# Patient Record
Sex: Male | Born: 1975
Health system: Southern US, Community
[De-identification: ages and names within clinical notes are randomized; demographics above are authoritative.]

## PROBLEM LIST (undated history)

## (undated) DIAGNOSIS — M1990 Unspecified osteoarthritis, unspecified site: Secondary | ICD-10-CM

## (undated) DIAGNOSIS — F419 Anxiety disorder, unspecified: Secondary | ICD-10-CM

## (undated) HISTORY — PX: TONSILLECTOMY: SUR1361

## (undated) HISTORY — PX: HERNIA REPAIR: SHX51

---

## 2009-09-08 ENCOUNTER — Emergency Department (HOSPITAL_COMMUNITY): Admission: EM | Admit: 2009-09-08 | Discharge: 2009-09-08 | Payer: Self-pay | Admitting: Emergency Medicine

## 2010-03-06 ENCOUNTER — Other Ambulatory Visit: Admission: RE | Admit: 2010-03-06 | Discharge: 2010-03-06 | Payer: Self-pay | Admitting: Urology

## 2013-04-08 ENCOUNTER — Other Ambulatory Visit: Payer: Self-pay | Admitting: Family Medicine

## 2013-04-28 ENCOUNTER — Encounter: Payer: Self-pay | Admitting: Family Medicine

## 2013-04-28 ENCOUNTER — Ambulatory Visit (INDEPENDENT_AMBULATORY_CARE_PROVIDER_SITE_OTHER): Payer: BC Managed Care – PPO | Admitting: Family Medicine

## 2013-04-28 VITALS — BP 128/70 | Ht 71.0 in | Wt 177.0 lb

## 2013-04-28 DIAGNOSIS — K429 Umbilical hernia without obstruction or gangrene: Secondary | ICD-10-CM

## 2013-04-28 NOTE — Progress Notes (Signed)
  Subjective:    Patient ID: Alan Barrera, male    DOB: 1975/09/25, 37 y.o.   MRN: [id number]  HPIUmbilical hernia.  Patient notes. Umbilical pain. Sense of bloating. He umbilicus is more punched out compared to before and now somewhat enlarged. Worse with certain motions. He's been watching this for some time.    Review of Systems No change in bowel habits no vomiting no back pain    Objective:   Physical Exam  Alert no acute distress. Lungs clear. Heart regular rate and rhythm. Abdomen positive periumbilical pain the deep palpation positive palpable and very sensitive hernia in the umbilical region.      Assessment & Plan:  Umbilical hernia discuss needs intervention #2 depression clinically stable maintain same meds. Plan diet exercise discussed.

## 2013-05-09 ENCOUNTER — Encounter (HOSPITAL_COMMUNITY): Payer: Self-pay | Admitting: Pharmacy Technician

## 2013-05-12 ENCOUNTER — Encounter (HOSPITAL_COMMUNITY): Payer: Self-pay

## 2013-05-12 ENCOUNTER — Encounter (HOSPITAL_COMMUNITY)
Admission: RE | Admit: 2013-05-12 | Discharge: 2013-05-12 | Disposition: A | Discharge status: Home / Self Care | Payer: BC Managed Care – PPO | Source: Ambulatory Visit | Attending: General Surgery | Admitting: General Surgery

## 2013-05-12 DIAGNOSIS — Z01812 Encounter for preprocedural laboratory examination: Secondary | ICD-10-CM | POA: Insufficient documentation

## 2013-05-12 HISTORY — DX: Anxiety disorder, unspecified: F41.9

## 2013-05-12 LAB — CBC
HCT: 47.6 % (ref 39.0–52.0)
MCH: 31.8 pg (ref 26.0–34.0)
MCV: 91.7 fL (ref 78.0–100.0)
Platelets: 193 10*3/uL (ref 150–400)
WBC: 4.5 10*3/uL (ref 4.0–10.5)

## 2013-05-12 LAB — BASIC METABOLIC PANEL
BUN: 11 mg/dL (ref 6–23)
Calcium: 9.9 mg/dL (ref 8.4–10.5)
Creatinine, Ser: 0.91 mg/dL (ref 0.50–1.35)
GFR calc Af Amer: >90 mL/min (ref >90)
Sodium: 138 mEq/L (ref 135–145)

## 2013-05-12 NOTE — Patient Instructions (Signed)
Alan Barrera  05/12/2013   Your procedure is scheduled on:   05/19/2013  Report to [facility] at  720  AM.  Call this number if you have problems the morning of surgery: [phone]   Remember:   Do not eat food or drink liquids after midnight.   Take these medicines the morning of surgery with A SIP OF WATER: celexa   Do not wear jewelry, make-up or nail polish.  Do not wear lotions, powders, or perfumes.   Do not shave 48 hours prior to surgery. Men may shave face and neck.  Do not bring valuables to the hospital.  [facility] is not responsible for any belongings or valuables.               Contacts, dentures or bridgework may not be worn into surgery.  Leave suitcase in the car. After surgery it may be brought to your room.  For patients admitted to the hospital, discharge time is determined by your treatment team.               Patients discharged the day of surgery will not be allowed to drive home.  Name and phone number of your driver: family  Special Instructions: Shower using CHG 2 nights before surgery and the night before surgery.  If you shower the day of surgery use CHG.  Use special wash - you have one bottle of CHG for all showers.  You should use approximately 1/3 of the bottle for each shower.   Please read over the following fact sheets that you were given: Pain Booklet, Coughing and Deep Breathing, Surgical Site Infection Prevention, Anesthesia Post-op Instructions and Care and Recovery After Surgery Hernia A hernia happens when an organ inside your body pushes out through a weak spot in your belly (abdominal) wall. Most hernias get worse over time. They can often be pushed back into place (reduced). Surgery may be needed to repair hernias that cannot be pushed into place. HOME CARE  Keep doing normal activities.  Avoid lifting more than 10 pounds (4.5 kilograms).  Cough gently and avoid straining. Over time, these things will:  Increase your  hernia size.  Irritate your hernia.  Break down hernia repairs.  Stop smoking.  Do not wear anything tight over your hernia. Do not keep the hernia in with an outside bandage.  Eat food that is high in fiber (fruit, vegetables, whole grains).  Drink enough fluids to keep your pee (urine) clear or pale yellow.  Take medicines to make your poop soft (stool softeners) if you cannot poop (constipated). GET HELP RIGHT AWAY IF:   You have a fever.  You have belly pain that gets worse.  You feel sick to your stomach (nauseous) and throw up (vomit).  Your skin starts to bulge out.  Your hernia turns a different color, feels hard, or is tender.  You have increased pain or puffiness (swelling) around the hernia.  You poop more or less often.  Your poop does not look the way normally does.  You have watery poop (diarrhea).  You cannot push the hernia back in place by applying gentle pressure while lying down. MAKE SURE YOU:   Understand these instructions.  Will watch your condition.  Will get help right away if you are not doing well or get worse. Document Released: 12/31/2009 Document Revised: 10/05/2011 Document Reviewed: 12/31/2009 [facility] Patient Information 2014 [place], [place]. PATIENT INSTRUCTIONS POST-ANESTHESIA  IMMEDIATELY FOLLOWING SURGERY:  Do not drive or operate machinery for the first twenty four hours after surgery.  Do not make any important decisions for twenty four hours after surgery or while taking narcotic pain medications or sedatives.  If you develop intractable nausea and vomiting or a severe headache please notify your doctor immediately.  FOLLOW-UP:  Please make an appointment with your surgeon as instructed. You do not need to follow up with anesthesia unless specifically instructed to do so.  WOUND CARE INSTRUCTIONS (if applicable):  Keep a dry clean dressing on the anesthesia/puncture wound site if there is drainage.  Once the wound has quit  draining you may leave it open to air.  Generally you should leave the bandage intact for twenty four hours unless there is drainage.  If the epidural site drains for more than 36-48 hours please call the anesthesia department.  QUESTIONS?:  Please feel free to call your physician or the hospital operator if you have any questions, and they will be happy to assist you.

## 2013-05-29 ENCOUNTER — Encounter (HOSPITAL_COMMUNITY)
Admission: RE | Admit: 2013-05-29 | Discharge: 2013-05-29 | Disposition: A | Discharge status: Home / Self Care | Payer: BC Managed Care – PPO | Source: Ambulatory Visit | Attending: Family Medicine | Admitting: Family Medicine

## 2013-06-02 ENCOUNTER — Encounter (HOSPITAL_COMMUNITY): Payer: BC Managed Care – PPO | Admitting: Anesthesiology

## 2013-06-02 ENCOUNTER — Encounter (HOSPITAL_COMMUNITY)
Admission: RE | Disposition: A | Discharge status: Home / Self Care | Payer: Self-pay | Source: Ambulatory Visit | Attending: General Surgery

## 2013-06-02 ENCOUNTER — Ambulatory Visit (HOSPITAL_COMMUNITY): Payer: BC Managed Care – PPO | Admitting: Anesthesiology

## 2013-06-02 ENCOUNTER — Ambulatory Visit (HOSPITAL_COMMUNITY)
Admission: RE | Admit: 2013-06-02 | Discharge: 2013-06-02 | Disposition: A | Discharge status: Home / Self Care | Payer: BC Managed Care – PPO | Source: Ambulatory Visit | Attending: General Surgery | Admitting: General Surgery

## 2013-06-02 ENCOUNTER — Encounter (HOSPITAL_COMMUNITY): Payer: Self-pay | Admitting: *Deleted

## 2013-06-02 DIAGNOSIS — K429 Umbilical hernia without obstruction or gangrene: Secondary | ICD-10-CM | POA: Insufficient documentation

## 2013-06-02 HISTORY — PX: UMBILICAL HERNIA REPAIR: SHX196

## 2013-06-02 HISTORY — PX: INSERTION OF MESH: SHX5868

## 2013-06-02 SURGERY — REPAIR, HERNIA, UMBILICAL, ADULT
Anesthesia: General | Site: Abdomen | Wound class: Clean

## 2013-06-02 MED ORDER — CEFAZOLIN SODIUM-DEXTROSE 2-3 GM-% IV SOLR
INTRAVENOUS | Status: AC
  Filled 2013-06-02: qty 50

## 2013-06-02 MED ORDER — LACTATED RINGERS IV SOLN
INTRAVENOUS | Status: DC | PRN
  Administered 2013-06-02 (×2): via INTRAVENOUS

## 2013-06-02 MED ORDER — ONDANSETRON HCL 4 MG/2ML IJ SOLN
INTRAMUSCULAR | Status: AC
  Filled 2013-06-02: qty 2

## 2013-06-02 MED ORDER — CHLORHEXIDINE GLUCONATE 4 % EX LIQD: 1.0000 "application " | Freq: Once | CUTANEOUS | Status: DC

## 2013-06-02 MED ORDER — FENTANYL CITRATE 0.05 MG/ML IJ SOLN
INTRAMUSCULAR | Status: DC | PRN
  Administered 2013-06-02 (×5): 50 ug via INTRAVENOUS

## 2013-06-02 MED ORDER — FENTANYL CITRATE 0.05 MG/ML IJ SOLN
25.0000 ug | INTRAMUSCULAR | Status: AC
  Administered 2013-06-02 (×2): 25 ug via INTRAVENOUS

## 2013-06-02 MED ORDER — FENTANYL CITRATE 0.05 MG/ML IJ SOLN
INTRAMUSCULAR | Status: AC
  Filled 2013-06-02: qty 5

## 2013-06-02 MED ORDER — MIDAZOLAM HCL 2 MG/2ML IJ SOLN
1.0000 mg | INTRAMUSCULAR | Status: DC | PRN
  Administered 2013-06-02: 2 mg via INTRAVENOUS

## 2013-06-02 MED ORDER — SUCCINYLCHOLINE CHLORIDE 20 MG/ML IJ SOLN
INTRAMUSCULAR | Status: AC
  Filled 2013-06-02: qty 1

## 2013-06-02 MED ORDER — FENTANYL CITRATE 0.05 MG/ML IJ SOLN
INTRAMUSCULAR | Status: AC
  Filled 2013-06-02: qty 2

## 2013-06-02 MED ORDER — 0.9 % SODIUM CHLORIDE (POUR BTL) OPTIME
TOPICAL | Status: DC | PRN
  Administered 2013-06-02: 500 mL

## 2013-06-02 MED ORDER — BUPIVACAINE HCL (PF) 0.5 % IJ SOLN
INTRAMUSCULAR | Status: DC | PRN
  Administered 2013-06-02: 5 mL

## 2013-06-02 MED ORDER — HYDROCODONE-ACETAMINOPHEN 5-325 MG PO TABS: 1.0000 | ORAL_TABLET | ORAL | Status: DC | PRN

## 2013-06-02 MED ORDER — LACTATED RINGERS IV SOLN
INTRAVENOUS | Status: DC
  Administered 2013-06-02: 09:00:00 via INTRAVENOUS

## 2013-06-02 MED ORDER — FENTANYL CITRATE 0.05 MG/ML IJ SOLN
25.0000 ug | INTRAMUSCULAR | Status: DC | PRN
  Administered 2013-06-02 (×3): 50 ug via INTRAVENOUS

## 2013-06-02 MED ORDER — LIDOCAINE HCL (PF) 1 % IJ SOLN
INTRAMUSCULAR | Status: AC
  Filled 2013-06-02: qty 5

## 2013-06-02 MED ORDER — MIDAZOLAM HCL 2 MG/2ML IJ SOLN
INTRAMUSCULAR | Status: AC
  Filled 2013-06-02: qty 2

## 2013-06-02 MED ORDER — PROPOFOL 10 MG/ML IV BOLUS
INTRAVENOUS | Status: DC | PRN
  Administered 2013-06-02: 150 mg via INTRAVENOUS
  Administered 2013-06-02 (×2): 50 mg via INTRAVENOUS

## 2013-06-02 MED ORDER — PROPOFOL 10 MG/ML IV BOLUS
INTRAVENOUS | Status: AC
  Filled 2013-06-02: qty 20

## 2013-06-02 MED ORDER — GLYCOPYRROLATE 0.2 MG/ML IJ SOLN
INTRAMUSCULAR | Status: DC | PRN
  Administered 2013-06-02: 0.2 mg via INTRAVENOUS

## 2013-06-02 MED ORDER — POVIDONE-IODINE 10 % EX OINT
TOPICAL_OINTMENT | CUTANEOUS | Status: AC
  Filled 2013-06-02: qty 1

## 2013-06-02 MED ORDER — LIDOCAINE HCL (CARDIAC) 20 MG/ML IV SOLN
INTRAVENOUS | Status: DC | PRN
  Administered 2013-06-02: 50 mg via INTRAVENOUS

## 2013-06-02 MED ORDER — CEFAZOLIN SODIUM-DEXTROSE 2-3 GM-% IV SOLR
2.0000 g | INTRAVENOUS | Status: AC
  Administered 2013-06-02: 2 g via INTRAVENOUS

## 2013-06-02 MED ORDER — ONDANSETRON HCL 4 MG/2ML IJ SOLN
4.0000 mg | Freq: Once | INTRAMUSCULAR | Status: AC
  Administered 2013-06-02: 4 mg via INTRAVENOUS

## 2013-06-02 MED ORDER — BUPIVACAINE HCL (PF) 0.5 % IJ SOLN
INTRAMUSCULAR | Status: AC
  Filled 2013-06-02: qty 30

## 2013-06-02 MED ORDER — ONDANSETRON HCL 4 MG/2ML IJ SOLN: 4.0000 mg | Freq: Once | INTRAMUSCULAR | Status: DC | PRN

## 2013-06-02 MED ORDER — POVIDONE-IODINE 10 % OINT PACKET
TOPICAL_OINTMENT | CUTANEOUS | Status: DC | PRN
  Administered 2013-06-02: 1 via TOPICAL

## 2013-06-02 MED ORDER — ROCURONIUM BROMIDE 50 MG/5ML IV SOLN
INTRAVENOUS | Status: AC
  Filled 2013-06-02: qty 1

## 2013-06-02 MED ORDER — EPHEDRINE SULFATE 50 MG/ML IJ SOLN
INTRAMUSCULAR | Status: DC | PRN
  Administered 2013-06-02: 10 mg via INTRAVENOUS

## 2013-06-02 MED ORDER — ATROPINE SULFATE 0.4 MG/ML IJ SOLN
INTRAMUSCULAR | Status: DC | PRN
  Administered 2013-06-02 (×2): 0.2 mg via INTRAVENOUS

## 2013-06-02 MED ORDER — KETOROLAC TROMETHAMINE 30 MG/ML IJ SOLN
30.0000 mg | Freq: Once | INTRAMUSCULAR | Status: AC
  Administered 2013-06-02: 30 mg via INTRAVENOUS
  Filled 2013-06-02: qty 1

## 2013-06-02 SURGICAL SUPPLY — 33 items
BAG HAMPER (MISCELLANEOUS) ×2 IMPLANT
BLADE SURG SZ11 CARB STEEL (BLADE) ×2 IMPLANT
CLOTH BEACON ORANGE TIMEOUT ST (SAFETY) ×2 IMPLANT
COVER LIGHT HANDLE STERIS (MISCELLANEOUS) ×4 IMPLANT
DECANTER SPIKE VIAL GLASS SM (MISCELLANEOUS) ×2 IMPLANT
DURAPREP 26ML APPLICATOR (WOUND CARE) ×2 IMPLANT
ELECT REM PT RETURN 9FT ADLT (ELECTROSURGICAL) ×2
ELECTRODE REM PT RTRN 9FT ADLT (ELECTROSURGICAL) ×1 IMPLANT
GLOVE BIOGEL PI IND STRL 8 (GLOVE) ×1 IMPLANT
GLOVE BIOGEL PI INDICATOR 8 (GLOVE) ×1
GLOVE ECLIPSE 7.0 STRL STRAW (GLOVE) ×2 IMPLANT
GLOVE ECLIPSE 7.5 STRL STRAW (GLOVE) ×2 IMPLANT
GLOVE INDICATOR 7.5 STRL GRN (GLOVE) ×2 IMPLANT
GOWN STRL REIN XL XLG (GOWN DISPOSABLE) ×4 IMPLANT
INST SET MINOR GENERAL (KITS) ×2 IMPLANT
KIT ROOM TURNOVER APOR (KITS) ×2 IMPLANT
LIGASURE IMPACT 36 18CM CVD LR (INSTRUMENTS) ×2 IMPLANT
MANIFOLD NEPTUNE II (INSTRUMENTS) ×2 IMPLANT
NEEDLE HYPO 25X1 1.5 SAFETY (NEEDLE) ×2 IMPLANT
NS IRRIG 1000ML POUR BTL (IV SOLUTION) ×2 IMPLANT
PACK MINOR (CUSTOM PROCEDURE TRAY) ×2 IMPLANT
PAD ARMBOARD 7.5X6 YLW CONV (MISCELLANEOUS) ×2 IMPLANT
PATCH VENTRAL SMALL 4.3 (Mesh Specialty) ×2 IMPLANT
SET BASIN LINEN APH (SET/KITS/TRAYS/PACK) ×2 IMPLANT
SPONGE GAUZE 2X2 8PLY STRL LF (GAUZE/BANDAGES/DRESSINGS) ×2 IMPLANT
STAPLER VISISTAT (STAPLE) ×2 IMPLANT
SUT ETHIBOND NAB MO 7 #0 18IN (SUTURE) ×2 IMPLANT
SUT VIC AB 2-0 CT2 27 (SUTURE) ×2 IMPLANT
SUT VIC AB 3-0 SH 27 (SUTURE) ×1
SUT VIC AB 3-0 SH 27X BRD (SUTURE) ×1 IMPLANT
SUT VICRYL AB 3 0 TIES (SUTURE) IMPLANT
SYR CONTROL 10ML LL (SYRINGE) ×2 IMPLANT
TAPE CLOTH SURG 4X10 WHT LF (GAUZE/BANDAGES/DRESSINGS) ×2 IMPLANT

## 2013-06-02 NOTE — Op Note (Signed)
Patient:  Alan Barrera  DOB:  21-Apr-1976  MRN:  [id number]   Preop Diagnosis:  Umbilical hernia  Postop Diagnosis:  Same  Procedure:  Umbilical herniorrhaphy with mesh  Surgeon:  Franky Macho, M.D.  Anes:  General  Indications:  Patient is a 37 year old white male who was lifting a heavy object at work and developed an umbilical hernia. The risks and benefits of the procedure including bleeding, infection, and recurrence of the hernia were fully explained to the patient, who gave informed consent.  Procedure note:  The patient was placed in the supine position. After general anesthesia was administered, the abdomen was prepped and draped using usual sterile technique with DuraPrep. Surgical site confirmation was performed.  An infraumbilical incision was made down to the fascia. The umbilicus was freed away from the underlying hernia defect. The hernia sac was excised and omentum was noted to be achieved to this area. In order to facilitate reduction of the hernia, and a small amount of omentum was excised using the LigaSure. The omentum was then reduced into the abdominal cavity and a 4.2 cm proceed patch was placed in this region and secured to the fascia using 2-0 Ethibond interrupted sutures. The overlying fascia to cover the mesh was closed transversely using 2-0 Ethibond interrupted sutures. The umbilicus was secured back to the fascia using a 2-0 Vicryl interrupted suture. The subcutaneous layer was reapproximated using 3-0 Vicryl interrupted suture. The skin was closed using staples. 0.5% Sensorcaine was instilled the surrounding wound. Betadine ointment and dressed a dressing were applied.  All tape and needle counts were correct at the end of the procedure. Patient was awakened and transferred to PACU in stable condition.  Complications:  None  EBL:  Minimal  Specimen:  None

## 2013-06-02 NOTE — H&P (Signed)
Alan Barrera is an 37 y.o. male.   Chief Complaint: Umbilical hernia HPI: Patient is a 37 year old white male who sustained an umbilical hernia while lifting something heavy at work. He now presents for an umbilical herniorrhaphy.  Past Medical History  Diagnosis Date  . Anxiety     Past Surgical History  Procedure Laterality Date  . Tonsillectomy      History reviewed. No pertinent family history. Social History:  reports that he has never smoked. He does not have any smokeless tobacco history on file. He reports that he does not drink alcohol or use illicit drugs.  Allergies: No Known Allergies  Medications Prior to Admission  Medication Sig Dispense Refill  . citalopram (CELEXA) 40 MG tablet Take 40 mg by mouth daily.        No results found for this or any previous visit (from the past 48 hour(s)). No results found.  Review of Systems  Constitutional: Negative.   HENT: Negative.   Respiratory: Negative.   Cardiovascular: Negative.   Gastrointestinal: Positive for abdominal pain.  Skin: Negative.   All other systems reviewed and are negative.    Blood pressure 133/88, pulse 73, temperature 97.7 F (36.5 C), temperature source Oral, resp. rate 24, SpO2 97.00%. Physical Exam  Constitutional: He is oriented to person, place, and time. He appears well-developed and well-nourished.  HENT:  Head: Normocephalic and atraumatic.  Neck: Normal range of motion. Neck supple.  Cardiovascular: Normal rate, regular rhythm and normal heart sounds.   Respiratory: Effort normal and breath sounds normal.  GI: Soft. Bowel sounds are normal. He exhibits no distension. There is no rebound.  Moderate size umbilical hernia, reducible.  Neurological: He is alert and oriented to person, place, and time.  Skin: Skin is warm and dry.     Assessment/Plan Impression: Umbilical hernia Plan: We'll proceed with an umbilical herniorrhaphy with mesh on 06/02/2013. The risks and benefits  of the procedure including bleeding, infection, and recurrence of the hernia were fully explained to the patient, who gave informed consent.  Alan Barrera A 06/02/2013, 8:11 AM

## 2013-06-02 NOTE — Transfer of Care (Signed)
Immediate Anesthesia Transfer of Care Note  Patient: Alan Barrera  Procedure(s) Performed: Procedure(s): HERNIA REPAIR UMBILICAL ADULT (N/A) INSERTION OF MESH (N/A)  Patient Location: PACU  Anesthesia Type:General  Level of Consciousness: awake, alert , oriented and patient cooperative  Airway & Oxygen Therapy: Patient Spontanous Breathing and Patient connected to face mask oxygen  Post-op Assessment: Report given to PACU RN and Post -op Vital signs reviewed and stable  Post vital signs: Reviewed and stable  Complications: No apparent anesthesia complications

## 2013-06-02 NOTE — Interval H&P Note (Signed)
History and Physical Interval Note:  06/02/2013 8:13 AM  Alan Barrera  has presented today for surgery, with the diagnosis of umbilical hernia  The various methods of treatment have been discussed with the patient and family. After consideration of risks, benefits and other options for treatment, the patient has consented to  Procedure(s): HERNIA REPAIR UMBILICAL ADULT (N/A) as a surgical intervention .  The patient's history has been reviewed, patient examined, no change in status, stable for surgery.  I have reviewed the patient's chart and labs.  Questions were answered to the patient's satisfaction.     Franky Macho A

## 2013-06-02 NOTE — Anesthesia Preprocedure Evaluation (Addendum)
Anesthesia Evaluation  Patient identified by MRN, date of birth, ID band Patient awake    Reviewed: Allergy & Precautions, H&P , NPO status , Patient's Chart, lab work & pertinent test results, reviewed documented beta blocker date and time   Airway Mallampati: I TM Distance: >3 FB     Dental  (+) Teeth Intact   Pulmonary neg pulmonary ROS,  breath sounds clear to auscultation        Cardiovascular negative cardio ROS  Rhythm:Regular Rate:Normal     Neuro/Psych PSYCHIATRIC DISORDERS Anxiety    GI/Hepatic negative GI ROS,   Endo/Other    Renal/GU      Musculoskeletal   Abdominal   Peds  Hematology   Anesthesia Other Findings   Reproductive/Obstetrics                          Anesthesia Physical Anesthesia Plan  ASA: II  Anesthesia Plan: General   Post-op Pain Management:    Induction: Intravenous  Airway Management Planned: LMA  Additional Equipment:   Intra-op Plan:   Post-operative Plan: Extubation in OR  Informed Consent: I have reviewed the patients History and Physical, chart, labs and discussed the procedure including the risks, benefits and alternatives for the proposed anesthesia with the patient or authorized representative who has indicated his/her understanding and acceptance.     Plan Discussed with:   Anesthesia Plan Comments:         Anesthesia Quick Evaluation

## 2013-06-02 NOTE — Anesthesia Postprocedure Evaluation (Signed)
  Anesthesia Post-op Note  Patient: Alan Barrera  Procedure(s) Performed: Procedure(s): HERNIA REPAIR UMBILICAL ADULT (N/A) INSERTION OF MESH (N/A)  Patient Location: PACU  Anesthesia Type:General  Level of Consciousness: awake, alert , oriented and patient cooperative  Airway and Oxygen Therapy: Patient Spontanous Breathing and Patient connected to face mask oxygen  Post-op Pain: mild  Post-op Assessment: Post-op Vital signs reviewed, Patient's Cardiovascular Status Stable, Respiratory Function Stable, Patent Airway, No signs of Nausea or vomiting and Pain level controlled  Post-op Vital Signs: Reviewed and stable  Complications: No apparent anesthesia complications

## 2013-06-02 NOTE — Anesthesia Procedure Notes (Signed)
Procedure Name: LMA Insertion Date/Time: 06/02/2013 8:58 AM Performed by: Pernell Dupre, Tailey Top A Pre-anesthesia Checklist: Patient identified, Timeout performed, Emergency Drugs available, Suction available and Patient being monitored Patient Re-evaluated:Patient Re-evaluated prior to inductionOxygen Delivery Method: Circle system utilized Preoxygenation: Pre-oxygenation with 100% oxygen Intubation Type: IV induction Ventilation: Mask ventilation without difficulty LMA: LMA inserted LMA Size: 4.0 Number of attempts: 1 Placement Confirmation: positive ETCO2 and breath sounds checked- equal and bilateral Tube secured with: Tape Dental Injury: Teeth and Oropharynx as per pre-operative assessment

## 2013-06-02 NOTE — Preoperative (Signed)
Beta Blockers   Reason not to administer Beta Blockers:Not Applicable 

## 2013-06-05 ENCOUNTER — Encounter (HOSPITAL_COMMUNITY): Payer: Self-pay | Admitting: General Surgery

## 2013-08-05 ENCOUNTER — Other Ambulatory Visit: Payer: Self-pay | Admitting: Family Medicine

## 2014-02-03 ENCOUNTER — Other Ambulatory Visit: Payer: Self-pay | Admitting: Family Medicine

## 2014-03-16 ENCOUNTER — Encounter: Payer: Self-pay | Admitting: Nurse Practitioner

## 2014-03-16 ENCOUNTER — Ambulatory Visit (INDEPENDENT_AMBULATORY_CARE_PROVIDER_SITE_OTHER): Payer: BC Managed Care – PPO | Admitting: Nurse Practitioner

## 2014-03-16 VITALS — BP 132/88 | Ht 71.0 in | Wt 172.8 lb

## 2014-03-16 DIAGNOSIS — R6882 Decreased libido: Secondary | ICD-10-CM

## 2014-03-16 DIAGNOSIS — B880 Other acariasis: Secondary | ICD-10-CM

## 2014-03-16 MED ORDER — PREDNISONE 20 MG PO TABS: ORAL_TABLET | ORAL | Status: DC

## 2014-03-16 MED ORDER — CLOBETASOL PROPIONATE 0.05 % EX CREA: 1.0000 "application " | TOPICAL_CREAM | Freq: Two times a day (BID) | CUTANEOUS | Status: DC

## 2014-03-16 NOTE — Patient Instructions (Signed)
Loratadine 10 mg in the morning Benadryl 25 mg at night 

## 2014-03-21 ENCOUNTER — Ambulatory Visit (INDEPENDENT_AMBULATORY_CARE_PROVIDER_SITE_OTHER): Payer: BC Managed Care – PPO | Admitting: Family Medicine

## 2014-03-21 ENCOUNTER — Encounter: Payer: Self-pay | Admitting: Family Medicine

## 2014-03-21 VITALS — BP 124/84 | Ht 71.0 in | Wt 171.0 lb

## 2014-03-21 DIAGNOSIS — R21 Rash and other nonspecific skin eruption: Secondary | ICD-10-CM

## 2014-03-21 MED ORDER — PREDNISONE 20 MG PO TABS: ORAL_TABLET | ORAL | Status: DC

## 2014-03-21 NOTE — Patient Instructions (Signed)
Tick Bite Information Ticks are insects that attach themselves to the skin and draw blood for food. There are various types of ticks. Common types include wood ticks and deer ticks. Most ticks live in shrubs and grassy areas. Ticks can climb onto your body when you make contact with leaves or grass where the tick is waiting. The most common places on the body for ticks to attach themselves are the scalp, neck, armpits, waist, and groin. Most tick bites are harmless, but sometimes ticks carry germs that cause diseases. These germs can be spread to a person during the tick's feeding process. The chance of a disease spreading through a tick bite depends on:   The type of tick.  Time of year.   How long the tick is attached.   Geographic location.  HOW CAN YOU PREVENT TICK BITES? Take these steps to help prevent tick bites when you are outdoors:  Wear protective clothing. Long sleeves and long pants are best.   Wear white clothes so you can see ticks more easily.  Tuck your pant legs into your socks.   If walking on a trail, stay in the middle of the trail to avoid brushing against bushes.  Avoid walking through areas with long grass.  Put insect repellent on all exposed skin and along boot tops, pant legs, and sleeve cuffs.   Check clothing, hair, and skin repeatedly and before going inside.   Brush off any ticks that are not attached.  Take a shower or bath as soon as possible after being outdoors.  WHAT IS THE PROPER WAY TO REMOVE A TICK? Ticks should be removed as soon as possible to help prevent diseases caused by tick bites. 1. If latex gloves are available, put them on before trying to remove a tick.  2. Using fine-point tweezers, grasp the tick as close to the skin as possible. You may also use curved forceps or a tick removal tool. Grasp the tick as close to its head as possible. Avoid grasping the tick on its body. 3. Pull gently with steady upward pressure until  the tick lets go. Do not twist the tick or jerk it suddenly. This may break off the tick's head or mouth parts. 4. Do not squeeze or crush the tick's body. This could force disease-carrying fluids from the tick into your body.  5. After the tick is removed, wash the bite area and your hands with soap and water or other disinfectant such as alcohol. 6. Apply a small amount of antiseptic cream or ointment to the bite site.  7. Wash and disinfect any instruments that were used.  Do not try to remove a tick by applying a hot match, petroleum jelly, or fingernail polish to the tick. These methods do not work and may increase the chances of disease being spread from the tick bite.  WHEN SHOULD YOU SEEK MEDICAL CARE? Contact your health care provider if you are unable to remove a tick from your skin or if a part of the tick breaks off and is stuck in the skin.  After a tick bite, you need to be aware of signs and symptoms that could be related to diseases spread by ticks. Contact your health care provider if you develop any of the following in the days or weeks after the tick bite:  Unexplained fever.  Rash. A circular rash that appears days or weeks after the tick bite may indicate the possibility of Lyme disease. The rash may resemble   a target with a bull's-eye and may occur at a different part of your body than the tick bite.  Redness and swelling in the area of the tick bite.   Tender, swollen lymph glands.   Diarrhea.   Weight loss.   Cough.   Fatigue.   Muscle, joint, or bone pain.   Abdominal pain.   Headache.   Lethargy or a change in your level of consciousness.  Difficulty walking or moving your legs.   Numbness in the legs.   Paralysis.  Shortness of breath.   Confusion.   Repeated vomiting.  Document Released: 07/10/2000 Document Revised: 05/03/2013 Document Reviewed: 12/21/2012 [employer] Patient Information 2015 [employer]. This information is  not intended to replace advice given to you by your health care provider. Make sure you discuss any questions you have with your health care provider.  

## 2014-03-21 NOTE — Progress Notes (Signed)
   Subjective:    Patient ID: Alan Barrera, male    DOB: [birth year], 38 y.o.   MRN: [id number]  HPI Patient is here today for follow up rash. Please see previous note he relates a lot of itching and burning  Review of Systems No fever no vomiting no tick bites    Objective:   Physical Exam He has an extensive amount of rash on lower abdomen some areas excoriated multiple small bumps that's consistent with straw mites. He should gradually get better with treatment       Assessment & Plan:  Rash-I. feel that this is exposure to straw mites. I believe that the patient sustained a fair amount of bites he has been using a cream which helps but he would benefit from prednisone to therefore we prescribed prednisone Benadryl as needed for itching should gradually get better. The prescription was faxed to [facility]

## 2014-03-22 ENCOUNTER — Encounter: Payer: Self-pay | Admitting: Nurse Practitioner

## 2014-03-22 NOTE — Progress Notes (Signed)
Subjective:  Presents complaints of rash that started over the past few days. Began after patient was doing some yard work cutting a tree. Very pruritic. 2 or 3 lesions on the arms. No fever. No known allergens. No cough runny nose wheezing sore throat or ear pain. At end of visit patient requesting a check on his testosterone level, has had some decreased libido and is being treated for erectile dysfunction. We will order level explained to patient that we're very careful about he receives testosterone therapy due to significant adverse effects such as heart attack or stroke in some patients.  Objective:   BP 132/88  Ht  (1.803 m)  Wt 172 lb 12.8 oz (78.382 kg)  BMI 24.11 kg/m2 NAD. Alert, oriented. Lungs clear. Heart regular rate rhythm. Multiple mildly erythematous whelps noted with some excoriation. A few have a tiny clear fluid filled papule in the center. Majority lesions on the lower abdomen and back area, 2 faint lesions on the right arm one on the left, no edema or erythema at the sites.  Assessment: Chigger bites  Decreased libido - Plan: Testosterone  Plan:  Meds ordered this encounter  Medications  . clobetasol cream (TEMOVATE) 0.05 %    Sig: Apply 1 application topically 2 (two) times daily.    Dispense:  30 g    Refill:  0    Order Specific Question:  Supervising Provider    Answer:  Merlyn Albert [[id number]]  . DISCONTD: predniSONE (DELTASONE) 20 MG tablet    Sig: 3 po qd x 3 d then 2 po qd x 3 d then 1 po qd x 3 d    Dispense:  18 tablet    Refill:  0    Order Specific Question:  Supervising Provider    Answer:  Riccardo Dubin   Given prescription for prednisone in case it is needed over the weekend. Antihistamines as directed. Expect gradual improvement, call back if worsens or persists.

## 2015-08-03 ENCOUNTER — Emergency Department (HOSPITAL_COMMUNITY): Payer: Self-pay

## 2015-08-03 ENCOUNTER — Encounter (HOSPITAL_COMMUNITY): Payer: Self-pay

## 2015-08-03 ENCOUNTER — Emergency Department (HOSPITAL_COMMUNITY)
Admission: EM | Admit: 2015-08-03 | Discharge: 2015-08-03 | Disposition: A | Discharge status: Home / Self Care | Payer: Self-pay | Attending: Emergency Medicine | Admitting: Emergency Medicine

## 2015-08-03 DIAGNOSIS — J069 Acute upper respiratory infection, unspecified: Secondary | ICD-10-CM | POA: Insufficient documentation

## 2015-08-03 DIAGNOSIS — Z7952 Long term (current) use of systemic steroids: Secondary | ICD-10-CM | POA: Insufficient documentation

## 2015-08-03 DIAGNOSIS — J40 Bronchitis, not specified as acute or chronic: Secondary | ICD-10-CM

## 2015-08-03 DIAGNOSIS — J209 Acute bronchitis, unspecified: Secondary | ICD-10-CM | POA: Insufficient documentation

## 2015-08-03 DIAGNOSIS — F419 Anxiety disorder, unspecified: Secondary | ICD-10-CM | POA: Insufficient documentation

## 2015-08-03 DIAGNOSIS — Z79899 Other long term (current) drug therapy: Secondary | ICD-10-CM | POA: Insufficient documentation

## 2015-08-03 DIAGNOSIS — Z9889 Other specified postprocedural states: Secondary | ICD-10-CM | POA: Insufficient documentation

## 2015-08-03 MED ORDER — CEPHALEXIN 500 MG PO CAPS
500.0000 mg | ORAL_CAPSULE | Freq: Once | ORAL | Status: AC
  Administered 2015-08-03: 500 mg via ORAL
  Filled 2015-08-03: qty 1

## 2015-08-03 MED ORDER — DEXAMETHASONE 4 MG PO TABS: 4.0000 mg | ORAL_TABLET | Freq: Two times a day (BID) | ORAL | Status: DC

## 2015-08-03 MED ORDER — HYDROCOD POLST-CPM POLST ER 10-8 MG/5ML PO SUER
5.0000 mL | Freq: Once | ORAL | Status: AC
  Administered 2015-08-03: 5 mL via ORAL
  Filled 2015-08-03: qty 5

## 2015-08-03 MED ORDER — CEPHALEXIN 500 MG PO CAPS: 500.0000 mg | ORAL_CAPSULE | Freq: Four times a day (QID) | ORAL | Status: DC

## 2015-08-03 MED ORDER — IBUPROFEN 800 MG PO TABS
800.0000 mg | ORAL_TABLET | Freq: Once | ORAL | Status: AC
  Administered 2015-08-03: 800 mg via ORAL
  Filled 2015-08-03: qty 1

## 2015-08-03 MED ORDER — HYDROCODONE-HOMATROPINE 5-1.5 MG/5ML PO SYRP: 5.0000 mL | ORAL_SOLUTION | Freq: Four times a day (QID) | ORAL | Status: DC | PRN

## 2015-08-03 NOTE — ED Notes (Signed)
Pt c/o cough x 4 days, fever 101.8 last night.  C/O burning in chest with coughing.

## 2015-08-03 NOTE — Discharge Instructions (Signed)
The chest x-ray is negative for acute problem. The oxygen level is 98% on room air. Suspect a bronchitis and upper respiratory infection. Please use Hycodan for cough. This medication may cause drowsiness, please use with caution. Please use Keflex and Decadron daily with food. Use Tylenol every 4 hours, or ibuprofen every 6 hours for fever or aching. Please see Dr. Gerda DissLuking for additional evaluation and follow-up in the office. Cough, Adult Coughing is a reflex that clears your throat and your airways. Coughing helps to heal and protect your lungs. It is normal to cough occasionally, but a cough that happens with other symptoms or lasts a long time may be a sign of a condition that needs treatment. A cough may last only 2-3 weeks (acute), or it may last longer than 8 weeks (chronic). CAUSES Coughing is commonly caused by:  Breathing in substances that irritate your lungs.  A viral or bacterial respiratory infection.  Allergies.  Asthma.  Postnasal drip.  Smoking.  Acid backing up from the stomach into the esophagus (gastroesophageal reflux).  Certain medicines.  Chronic lung problems, including COPD (or rarely, lung cancer).  Other medical conditions such as heart failure. HOME CARE INSTRUCTIONS  Pay attention to any changes in your symptoms. Take these actions to help with your discomfort:  Take medicines only as told by your health care provider.  If you were prescribed an antibiotic medicine, take it as told by your health care provider. Do not stop taking the antibiotic even if you start to feel better.  Talk with your health care provider before you take a cough suppressant medicine.  Drink enough fluid to keep your urine clear or pale yellow.  If the air is dry, use a cold steam vaporizer or humidifier in your bedroom or your home to help loosen secretions.  Avoid anything that causes you to cough at work or at home.  If your cough is worse at night, try sleeping in a  semi-upright position.  Avoid cigarette smoke. If you smoke, quit smoking. If you need help quitting, ask your health care provider.  Avoid caffeine.  Avoid alcohol.  Rest as needed. SEEK MEDICAL CARE IF:   You have new symptoms.  You cough up pus.  Your cough does not get better after 2-3 weeks, or your cough gets worse.  You cannot control your cough with suppressant medicines and you are losing sleep.  You develop pain that is getting worse or pain that is not controlled with pain medicines.  You have a fever.  You have unexplained weight loss.  You have night sweats. SEEK IMMEDIATE MEDICAL CARE IF:  You cough up blood.  You have difficulty breathing.  Your heartbeat is very fast.   This information is not intended to replace advice given to you by your health care provider. Make sure you discuss any questions you have with your health care provider.   Document Released: 01/09/2011 Document Revised: 04/03/2015 Document Reviewed: 09/19/2014 Elsevier Interactive Patient Education [employer]

## 2015-08-03 NOTE — ED Notes (Signed)
PA at bedside.

## 2015-08-03 NOTE — ED Provider Notes (Signed)
CSN: [id number]     Arrival date & time 08/03/15  1151 History   First MD Initiated Contact with Patient 08/03/15 1157     Chief Complaint  Patient presents with  . Cough     (Consider location/radiation/quality/duration/timing/severity/associated sxs/prior Treatment) Patient is a 40 y.o. male presenting with cough. The history is provided by the patient.  Cough Cough characteristics:  Non-productive Severity:  Moderate Onset quality:  Gradual Duration:  4 days Timing:  Intermittent Progression:  Worsening Chronicity:  New Smoker: no   Context: sick contacts and weather changes   Relieved by:  Nothing Worsened by:  Nothing tried Associated symptoms: chills, fever, headaches, myalgias and rhinorrhea   Associated symptoms: no ear pain, no rash, no shortness of breath and no wheezing   Risk factors: no recent travel     Past Medical History  Diagnosis Date  . Anxiety    Past Surgical History  Procedure Laterality Date  . Tonsillectomy    . Umbilical hernia repair N/A 06/02/2013    Procedure: HERNIA REPAIR UMBILICAL ADULT;  Surgeon: Dalia Heading, MD;  Location: [facility];  Service: General;  Laterality: N/A;  . Insertion of mesh N/A 06/02/2013    Procedure: INSERTION OF MESH;  Surgeon: Dalia Heading, MD;  Location: [facility];  Service: General;  Laterality: N/A;   No family history on file. Social History  Substance Use Topics  . Smoking status: Never Smoker   . Smokeless tobacco: None  . Alcohol Use: No    Review of Systems  Constitutional: Positive for fever and chills.  HENT: Positive for congestion and rhinorrhea. Negative for ear pain.   Respiratory: Positive for cough. Negative for shortness of breath and wheezing.   Musculoskeletal: Positive for myalgias.  Skin: Negative for rash.  Neurological: Positive for headaches.  All other systems reviewed and are negative.     Allergies  Review of patient's allergies indicates no known allergies.  Home Medications    Prior to Admission medications   Medication Sig Start Date End Date Taking? Authorizing Provider  cephALEXin (KEFLEX) 500 MG capsule Take 1 capsule (500 mg total) by mouth 4 (four) times daily. 08/03/15   Ivery Quale, PA-C  citalopram (CELEXA) 40 MG tablet TAKE 1 AND 1/2 TABLET BY MOUTH EVERY MORNING    Merlyn Albert, MD  clobetasol cream (TEMOVATE) 0.05 % Apply 1 application topically 2 (two) times daily. 03/16/14   Campbell Riches, NP  dexamethasone (DECADRON) 4 MG tablet Take 1 tablet (4 mg total) by mouth 2 (two) times daily with a meal. 08/03/15   Ivery Quale, PA-C  HYDROcodone-homatropine (HYCODAN) 5-1.5 MG/5ML syrup Take 5 mLs by mouth every 6 (six) hours as needed. 08/03/15   Ivery Quale, PA-C  predniSONE (DELTASONE) 20 MG tablet 3 po qd x 3 d then 2 po qd x 3 d then 1 po qd x 3 d 03/21/14   Babs Sciara, MD   BP 130/89 mmHg  Pulse 88  Temp(Src) 98.5 F (36.9 C) (Oral)  Resp 18  Ht 5\' 10"  (1.778 m)  Wt 79.833 kg  BMI 25.25 kg/m2  SpO2 98% Physical Exam  Constitutional: He is oriented to person, place, and time. He appears well-developed and well-nourished.  Non-toxic appearance.  HENT:  Head: Normocephalic.  Right Ear: Tympanic membrane and external ear normal.  Left Ear: Tympanic membrane and external ear normal.  Nasal congestion present. Mild increase redness of the posterior pharynx. Airway patent. Uvula mildly swollen, but midline.  Eyes: EOM and lids are normal. Pupils are equal, round, and reactive to light.  Neck: Normal range of motion. Neck supple. Carotid bruit is not present.  Cardiovascular: Normal rate, regular rhythm, normal heart sounds, intact distal pulses and normal pulses.   Pulmonary/Chest: Breath sounds normal. No respiratory distress.  Pt speaks in complete sentences.  Abdominal: Soft. Bowel sounds are normal. There is no tenderness. There is no guarding.  Musculoskeletal: Normal range of motion.  Lymphadenopathy:       Head (right side): No  submandibular adenopathy present.       Head (left side): No submandibular adenopathy present.    He has no cervical adenopathy.  Neurological: He is alert and oriented to person, place, and time. He has normal strength. No cranial nerve deficit or sensory deficit.  Skin: Skin is warm and dry. No rash noted.  Psychiatric: He has a normal mood and affect. His speech is normal.  Nursing note and vitals reviewed.   ED Course  Procedures (including critical care time) Labs Review Labs Reviewed - No data to display  Imaging Review Dg Chest 2 View  08/03/2015  CLINICAL DATA:  Cough, and fever, nonproductive cough for 4 days EXAM: CHEST  2 VIEW COMPARISON:  None. FINDINGS: Normal heart size. Clear lungs. No pneumothorax. No pleural effusion. IMPRESSION: No active cardiopulmonary disease. Electronically Signed   By: Jolaine ClickArthur  Hoss M.D.   On: 08/03/2015 12:39   I have personally reviewed and evaluated these images and lab results as part of my medical decision-making.   EKG Interpretation None      MDM  Vital signs stable. Pulse ox 98% on room air. Pt speaks in complete sentences. Exam favors bronchitis and upper respiratory infection. The patient will be treated with Keflex, Decadron, and Hycodan syrup. Patient is encouraged to increase fluids. To use his mask until symptoms have resolved. To see his primary physician Dr. Gerda DissLuking, in the office for additional evaluation and management if not improving. Patient acknowledges understanding of these structures.    Final diagnoses:  Bronchitis  URI (upper respiratory infection)    **I have reviewed nursing notes, vital signs, and all appropriate lab and imaging results for this patient.    Ivery QualeHobson Yael Angerer, PA-C 08/03/15 1331  Geoffery Lyonsouglas Delo, MD 08/04/15 [phone]

## 2016-07-17 ENCOUNTER — Encounter: Payer: Self-pay | Admitting: Family Medicine

## 2016-07-17 ENCOUNTER — Ambulatory Visit (INDEPENDENT_AMBULATORY_CARE_PROVIDER_SITE_OTHER): Payer: BLUE CROSS/BLUE SHIELD | Admitting: Family Medicine

## 2016-07-17 VITALS — BP 124/86 | Temp 98.5°F | Ht 69.0 in | Wt 181.0 lb

## 2016-07-17 DIAGNOSIS — G8929 Other chronic pain: Secondary | ICD-10-CM

## 2016-07-17 DIAGNOSIS — J310 Chronic rhinitis: Secondary | ICD-10-CM

## 2016-07-17 DIAGNOSIS — M5440 Lumbago with sciatica, unspecified side: Secondary | ICD-10-CM | POA: Diagnosis not present

## 2016-07-17 DIAGNOSIS — J329 Chronic sinusitis, unspecified: Secondary | ICD-10-CM

## 2016-07-17 MED ORDER — TRAMADOL HCL 50 MG PO TABS: 50.0000 mg | ORAL_TABLET | Freq: Four times a day (QID) | ORAL | 0 refills | Status: DC | PRN

## 2016-07-17 MED ORDER — PREDNISONE 20 MG PO TABS: ORAL_TABLET | ORAL | 0 refills | Status: DC

## 2016-07-17 MED ORDER — HYDROCODONE-ACETAMINOPHEN 5-325 MG PO TABS: 1.0000 | ORAL_TABLET | Freq: Every evening | ORAL | 0 refills | Status: DC | PRN

## 2016-07-17 MED ORDER — ETODOLAC 400 MG PO TABS
400.0000 mg | ORAL_TABLET | Freq: Two times a day (BID) | ORAL | 1 refills | Status: DC
Start: 2016-07-17 — End: 2017-03-30

## 2016-07-17 MED ORDER — AMOXICILLIN 500 MG PO CAPS: 500.0000 mg | ORAL_CAPSULE | Freq: Three times a day (TID) | ORAL | 0 refills | Status: DC

## 2016-07-17 NOTE — Progress Notes (Signed)
   Subjective:    Patient ID: Alan Barrera, male    DOB: 1975/09/21, 40 y.o.   MRN: [id number] Patient presents with 2 types of back pain Back Pain  This is a new problem. Episode onset: 3 weeks  The pain is present in the gluteal. Radiates to: left leg. Treatments tried: ibuprofen.  several weeks ago had a bad sneezing spell  Developed low back pain after bad sneezing  Now radiates to left hip, and pain radiates all the way down into left calf. Deep ache. Sometimes tooth achy in nature.  Patient gives also history of chronic low back pain. Comes and goes. Generally diffuse over the lumbar region. Without substantial radiation.  Taking ibu prof 800 mg every twice  Heavy equip and lifiting   Pt has had issues where the back would act up    Chest congestion, pain  In lungs. Started 3 days ago. Cough is productive. Positive phlegm intermittently. No fever no chills started with nasal congestion   Review of Systems  Musculoskeletal: Positive for back pain.       Objective:   Physical Exam Alert, mild malaise. Hydration good Vitals stable. frontal/ maxillary tenderness evident positive nasal congestion. pharynx normal neck supple  lungs clear/no crackles or wheezes. heart regular in rhythm Low back diffuse tenderness left side greater than right and apparently lumbar distribution. Plus minus straight leg raise on left. Some sciatic notch tenderness. Distal strength sensation       Assessment & Plan:  Impression 1 rhinosinusitis/bronchitis discussed #2 chronic low back pain. Intermittent comes and goes generally lumbar strain in nature and description. #3 sciatica left-sided. Has faded somewhat over the past week plan prednisone taper. Anti-inflammatory medication. Low back exercise discussed recommended. Antibiotics prescribed symptom care discussed. 25 minutes spent most in discussion regarding long-term stretching and strengthening this patient's best opportunity to avoid  recurrent back. Rationale for not doing MRI at this time discussed. 25 minutes spent most in discussion

## 2016-11-11 ENCOUNTER — Telehealth: Payer: Self-pay | Admitting: Family Medicine

## 2016-11-11 MED ORDER — CITALOPRAM HYDROBROMIDE 40 MG PO TABS: ORAL_TABLET | ORAL | 0 refills | Status: DC

## 2016-11-11 NOTE — Telephone Encounter (Signed)
Ok ththirty d wor

## 2016-11-11 NOTE — Telephone Encounter (Signed)
Refilled/send medication Rx Celexa to [facility], [place], [place]

## 2016-11-11 NOTE — Telephone Encounter (Signed)
Patient said he is out of town for work and left his Rx for Celexa at home.  He wants to know if we can send an Rx to [facility] in [place], [place].  [address] [employer]

## 2016-12-17 IMAGING — DX DG CHEST 2V
2 series · 2 of 2 positions shown · non-contrast
Comparison: None.

CLINICAL DATA: Cough, and fever, nonproductive cough for 4 days

EXAM:
CHEST  2 VIEW

[chest pa]
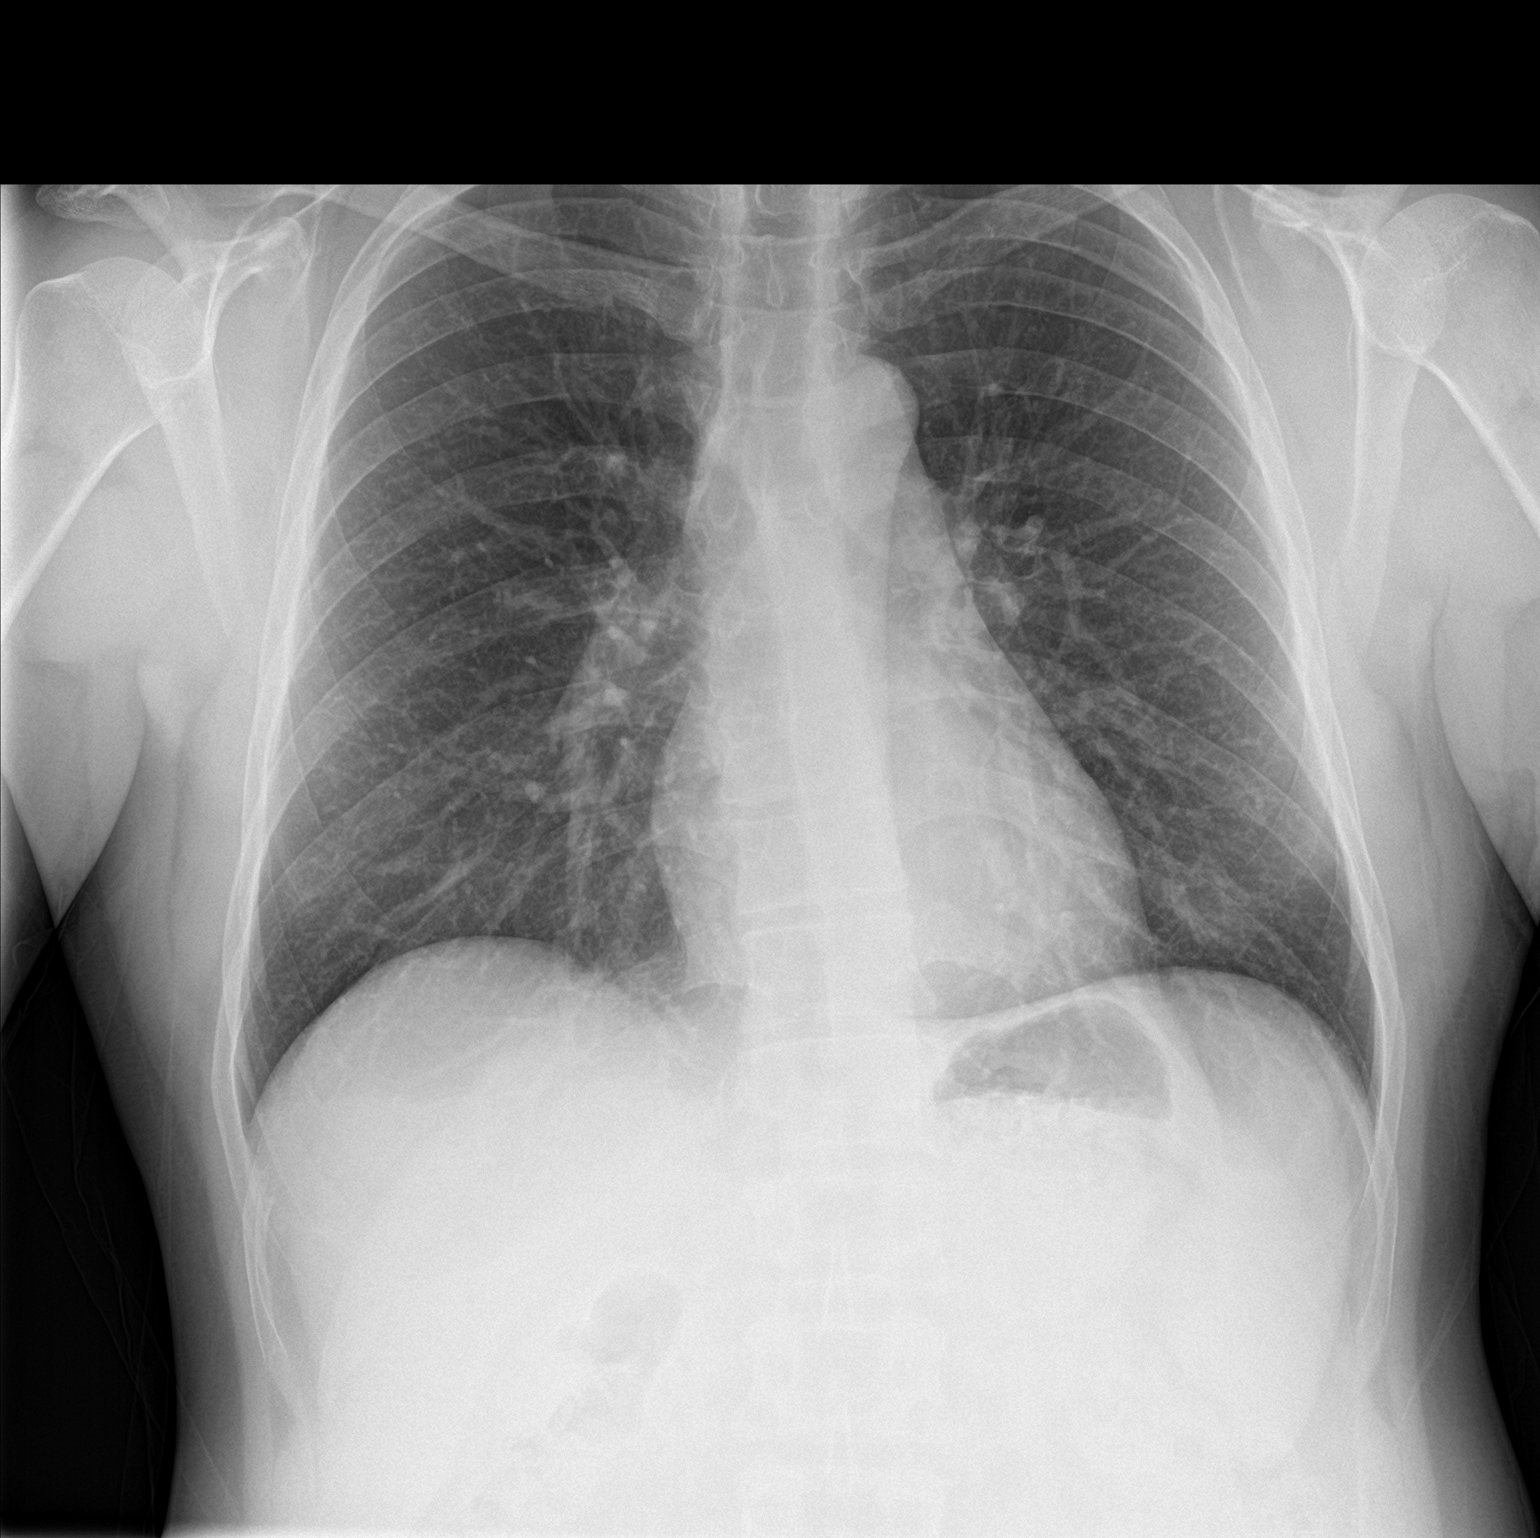

[chest lat]
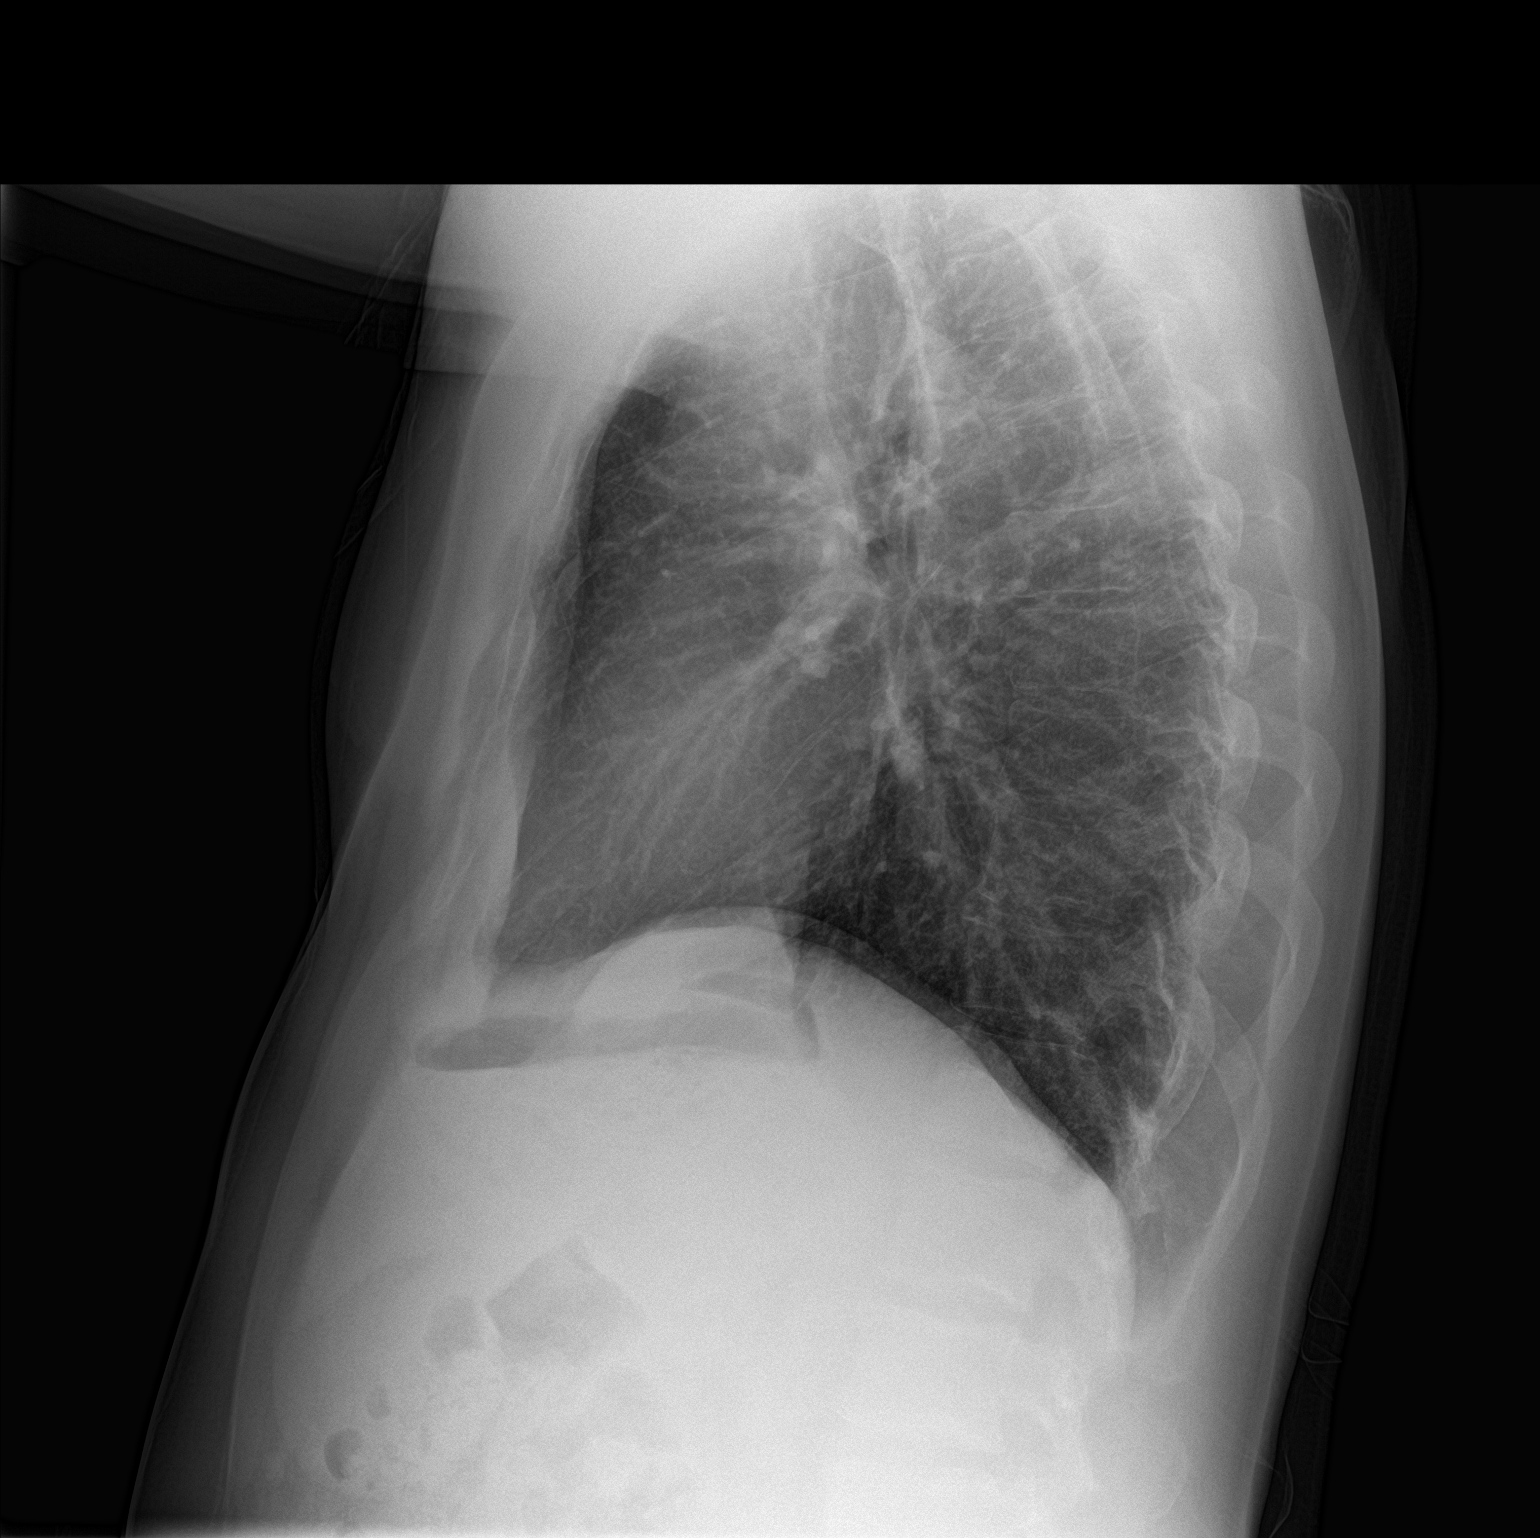

[2 of 2 positions shown; findings below may reference images not displayed]

FINDINGS: Normal heart size. Clear lungs. No pneumothorax. No pleural
effusion.
IMPRESSION: No active cardiopulmonary disease.

## 2017-02-11 ENCOUNTER — Encounter: Payer: BLUE CROSS/BLUE SHIELD | Admitting: Family Medicine

## 2017-02-19 ENCOUNTER — Encounter: Payer: Self-pay | Admitting: Family Medicine

## 2017-03-30 ENCOUNTER — Encounter: Payer: Self-pay | Admitting: Family Medicine

## 2017-03-30 ENCOUNTER — Ambulatory Visit (INDEPENDENT_AMBULATORY_CARE_PROVIDER_SITE_OTHER): Payer: BLUE CROSS/BLUE SHIELD | Admitting: Family Medicine

## 2017-03-30 VITALS — BP 128/86 | Ht 69.0 in | Wt 174.0 lb

## 2017-03-30 DIAGNOSIS — Z79899 Other long term (current) drug therapy: Secondary | ICD-10-CM | POA: Diagnosis not present

## 2017-03-30 DIAGNOSIS — Z13220 Encounter for screening for lipoid disorders: Secondary | ICD-10-CM

## 2017-03-30 DIAGNOSIS — F411 Generalized anxiety disorder: Secondary | ICD-10-CM | POA: Diagnosis not present

## 2017-03-30 MED ORDER — ESCITALOPRAM OXALATE 20 MG PO TABS: 20.0000 mg | ORAL_TABLET | Freq: Every day | ORAL | 5 refills | Status: DC

## 2017-03-30 NOTE — Progress Notes (Signed)
   Subjective:    Patient ID: Alan Barrera, male    DOB: Jul 31, 1975, 41 y.o.   MRN: [id number]  HPIFollow up on anxiety/Depression. Takes celexa 40mg  one and a half tablets daily. Pt states he feels med is not working like it use to.   Initially strted for itrritability and a little   Mo and fa are both on meds for m h issues, bro also   Has noted past three mo things not going as well for irritability and anxiety, not working well for him  Works for [employer]   wide open  irritabile at work too and that is causing some issues  exercises well, usually staying active.        Review of Systems No headache, no major weight loss or weight gain, no chest pain no back pain abdominal pain no change in bowel habits complete ROS otherwise negative     Objective:   Physical Exam Alert vitals stable, NAD. Blood pressure good on repeat. HEENT normal. Lungs clear. Heart regular rate and rhythm.        Assessment & Plan:  Impression generalized anxiety with irritability. PH 9 reveals no evidence of substantial depression. Celexa no longer working. Will switch to Lexapro 20

## 2017-05-05 ENCOUNTER — Encounter: Payer: BLUE CROSS/BLUE SHIELD | Admitting: Family Medicine

## 2017-05-05 DIAGNOSIS — Z029 Encounter for administrative examinations, unspecified: Secondary | ICD-10-CM

## 2017-05-06 ENCOUNTER — Encounter: Payer: Self-pay | Admitting: Family Medicine

## 2017-07-29 ENCOUNTER — Encounter: Payer: Self-pay | Admitting: Family Medicine

## 2018-08-08 ENCOUNTER — Encounter: Payer: Self-pay | Admitting: Family Medicine

## 2018-08-08 ENCOUNTER — Ambulatory Visit (INDEPENDENT_AMBULATORY_CARE_PROVIDER_SITE_OTHER): Payer: 59 | Admitting: Family Medicine

## 2018-08-08 VITALS — BP 124/90 | Ht 69.0 in | Wt 180.0 lb

## 2018-08-08 DIAGNOSIS — Z13220 Encounter for screening for lipoid disorders: Secondary | ICD-10-CM | POA: Diagnosis not present

## 2018-08-08 DIAGNOSIS — N539 Unspecified male sexual dysfunction: Secondary | ICD-10-CM | POA: Diagnosis not present

## 2018-08-08 DIAGNOSIS — F411 Generalized anxiety disorder: Secondary | ICD-10-CM | POA: Diagnosis not present

## 2018-08-08 DIAGNOSIS — Z79899 Other long term (current) drug therapy: Secondary | ICD-10-CM | POA: Diagnosis not present

## 2018-08-08 MED ORDER — CITALOPRAM HYDROBROMIDE 40 MG PO TABS: ORAL_TABLET | ORAL | 5 refills | Status: DC

## 2018-08-08 NOTE — Progress Notes (Signed)
   Subjective:    Patient ID: Alan Barrera, male    DOB: [birth year], 43 y.o.   MRN: [id number]  HPI  Patient is here today to follow up on his chronic health issues.   He has a history of anxiety and is currently on Lexapro 20 mg, in which he does not feel is of any help.  Sex drive he states "is not right".  Review of Systems     Objective:   Physical Exam        Assessment & Plan:

## 2018-08-08 NOTE — Progress Notes (Signed)
Subjective:     Patient ID: Alan Barrera, male   DOB: 21-Jul-1976, 43 y.o.   MRN: [id number]   HPI  Patient is here today to follow up on his chronic health issues.   He has a history of anxiety and is currently on Lexapro 20 mg, in which he does not feel is of any help.  Sex drive he states "is not right". Feels like he has been having problems for the last 10 years, not just while on medication. Reports decreased sex drive and problems with obtaining an erection. This is very concerning to the patient and he feels like there is something wrong.  Has been off of lexapro for about 1 week. Felt like celexa that he was on previously was more effective, reports was taking celexa 40 mg daily. Anxiety was not well controlled on lexapro. Feels like he gets more aggravated easily.  Has never done counseling, not interested at this time. Pt wants to start back on celexa. Denies SI/HI.       Review of Systems  Constitutional: Negative for unexpected weight change.  Respiratory: Negative for shortness of breath.   Cardiovascular: Negative for chest pain.  Gastrointestinal: Negative for abdominal pain.  Psychiatric/Behavioral: Positive for agitation. Negative for dysphoric mood, sleep disturbance and suicidal ideas. The patient is nervous/anxious.         Objective:   Physical Exam Physical Exam Vitals signs and nursing note reviewed.  Constitutional:      General: He is not in acute distress.    Appearance: Normal appearance. He is well-developed. He is not toxic-appearing.  HENT:     Head: Normocephalic and atraumatic.  Neck:     Musculoskeletal: Neck supple.  Cardiovascular:     Rate and Rhythm: Normal rate and regular rhythm.     Heart sounds: Normal heart sounds. No murmur.  Pulmonary:     Effort: Pulmonary effort is normal. No respiratory distress.     Breath sounds: Normal breath sounds.  Skin:    General: Skin is warm and dry.  Neurological:     Mental  Status: He is alert and oriented to person, place, and time.  Psychiatric:        Mood and Affect: Mood normal.        Behavior: Behavior normal.        Thought Content: Thought content normal.    Depression screen [facility] 2/9 08/08/2018 03/30/2017  Decreased Interest 2 0  Down, Depressed, Hopeless 1 1  PHQ - 2 Score 3 1  Altered sleeping 0 0  Tired, decreased energy 1 1  Change in appetite 0 0  Feeling bad or failure about yourself  0 0  Trouble concentrating 0 1  Moving slowly or fidgety/restless 0 0  Suicidal thoughts 0 0  PHQ-9 Score 4 3  Difficult doing work/chores Somewhat difficult -   GAD 7 : Generalized Anxiety Score 08/08/2018 03/30/2017  Nervous, Anxious, on Edge 2 1  Control/stop worrying 1 0  Worry too much - different things 1 1  Trouble relaxing 2 1  Restless 0 1  Easily annoyed or irritable 1 2  Afraid - awful might happen 1 0  Total GAD 7 Score 8 6  Anxiety Difficulty Not difficult at all -        Assessment:     Generalized anxiety disorder  Unspecified male sexual dysfunction - Plan: Testosterone  Screening, lipid - Plan: Lipid panel  High risk medication use - Plan: Hepatic function  panel, Basic metabolic panel      Plan:     1. GAD: Reviewed phq 9 and gad 7 with patient. Will restart celexa per request, start at 20 mg daily x 2 weeks then increase to 40 mg daily. Will stop medication and notify us if he has any problems. F/u in 1 month.  2. Sexual dysfunction: will obtain some basic screening lab work and recommend f/u in 4 weeks with Dr. Brett CanalesSteve to further discuss this.   Dr. Lubertha SouthSteve Luking was consulted on this case and is in agreement with the above treatment plan.

## 2018-08-09 LAB — BASIC METABOLIC PANEL
BUN/Creatinine Ratio: 10 (ref 9–20)
BUN: 11 mg/dL (ref 6–24)
CHLORIDE: 102 mmol/L (ref 96–106)
CO2: 23 mmol/L (ref 20–29)
Calcium: 9.4 mg/dL (ref 8.7–10.2)
Creatinine, Ser: 1.06 mg/dL (ref 0.76–1.27)
GFR calc Af Amer: 98 mL/min/{1.73_m2} (ref >59)
GFR calc non Af Amer: 85 mL/min/{1.73_m2} (ref >59)
Glucose: 129 mg/dL — ABNORMAL HIGH (ref 65–99)
Potassium: 4.2 mmol/L (ref 3.5–5.2)
Sodium: 140 mmol/L (ref 134–144)

## 2018-08-09 LAB — HEPATIC FUNCTION PANEL
ALT: 28 IU/L (ref 0–44)
AST: 17 IU/L (ref 0–40)
Albumin: 4.5 g/dL (ref 3.5–5.5)
Alkaline Phosphatase: 72 IU/L (ref 39–117)
BILIRUBIN TOTAL: 0.6 mg/dL (ref 0.0–1.2)
Bilirubin, Direct: 0.14 mg/dL (ref 0.00–0.40)
Total Protein: 6.7 g/dL (ref 6.0–8.5)

## 2018-08-09 LAB — LIPID PANEL
Chol/HDL Ratio: 4.1 ratio (ref 0.0–5.0)
Cholesterol, Total: 196 mg/dL (ref 100–199)
HDL: 48 mg/dL (ref >39)
LDL Calculated: 111 mg/dL — ABNORMAL HIGH (ref 0–99)
TRIGLYCERIDES: 187 mg/dL — AB (ref 0–149)
VLDL Cholesterol Cal: 37 mg/dL (ref 5–40)

## 2018-08-09 LAB — TESTOSTERONE: Testosterone: 528 ng/dL (ref 264–916)

## 2018-09-05 ENCOUNTER — Encounter: Payer: Self-pay | Admitting: Family Medicine

## 2018-09-05 ENCOUNTER — Ambulatory Visit (INDEPENDENT_AMBULATORY_CARE_PROVIDER_SITE_OTHER): Payer: 59 | Admitting: Family Medicine

## 2018-09-05 VITALS — BP 120/90 | Ht 69.0 in | Wt 181.0 lb

## 2018-09-05 DIAGNOSIS — N5201 Erectile dysfunction due to arterial insufficiency: Secondary | ICD-10-CM

## 2018-09-05 DIAGNOSIS — E119 Type 2 diabetes mellitus without complications: Secondary | ICD-10-CM

## 2018-09-05 DIAGNOSIS — N529 Male erectile dysfunction, unspecified: Secondary | ICD-10-CM | POA: Insufficient documentation

## 2018-09-05 LAB — POCT GLYCOSYLATED HEMOGLOBIN (HGB A1C): Hemoglobin A1C: 4.4 % (ref 4.0–5.6)

## 2018-09-05 MED ORDER — SILDENAFIL CITRATE 20 MG PO TABS: ORAL_TABLET | ORAL | 5 refills | Status: DC

## 2018-09-05 MED ORDER — CITALOPRAM HYDROBROMIDE 40 MG PO TABS: ORAL_TABLET | ORAL | 5 refills | Status: DC

## 2018-09-05 NOTE — Progress Notes (Signed)
Subjective:    Patient ID: Alan LericheRandall W Guallpa, male    DOB: 07-Jan-1976, 43 y.o.   MRN: [id number]  HPI Patient is here today to discuss his chronic health issues.  He has a history of General Anxiety disorder and is taking Celexa 40 mg once per day.  He says he just went up on the medication on Saturday.( he only five days left and would prefer to have # 90 day supply sent in).  He also had some blood work drawn 08/08/2017 that he wants to go over.  Results for orders placed or performed in visit on 08/08/18  Lipid panel  Result Value Ref Range   Cholesterol, Total 196 100 - 199 mg/dL   Triglycerides 563187 (H) 0 - 149 mg/dL   HDL 48 >87>39 mg/dL   VLDL Cholesterol Cal 37 5 - 40 mg/dL   LDL Calculated 564111 (H) 0 - 99 mg/dL   Chol/HDL Ratio 4.1 0.0 - 5.0 ratio  Hepatic function panel  Result Value Ref Range   Total Protein 6.7 6.0 - 8.5 g/dL   Albumin 4.5 3.5 - 5.5 g/dL   Bilirubin Total 0.6 0.0 - 1.2 mg/dL   Bilirubin, Direct 3.320.14 0.00 - 0.40 mg/dL   Alkaline Phosphatase 72 39 - 117 IU/L   AST 17 0 - 40 IU/L   ALT 28 0 - 44 IU/L  Basic metabolic panel  Result Value Ref Range   Glucose 129 (H) 65 - 99 mg/dL   BUN 11 6 - 24 mg/dL   Creatinine, Ser 9.511.06 0.76 - 1.27 mg/dL   GFR calc non Af Amer 85 >59 mL/min/1.73   GFR calc Af Amer 98 >59 mL/min/1.73   BUN/Creatinine Ratio 10 9 - 20   Sodium 140 134 - 144 mmol/L   Potassium 4.2 3.5 - 5.2 mmol/L   Chloride 102 96 - 106 mmol/L   CO2 23 20 - 29 mmol/L   Calcium 9.4 8.7 - 10.2 mg/dL  Testosterone  Result Value Ref Range   Testosterone 528 264 - 916 ng/dL   Anxiety overall doing quit a bit better'   Stays active doing reg activity   Libido not good for ten yrs   Also having difficulty with erections  Used otc upplement, took over the counter, helped some but a d headaches  No alcohol excess, rare one beer evey three months   Prediabetes? No fam hx of diab. Glu was up on b w but non fasing   No other  concerns.   Review of Systems No headache, no major weight loss or weight gain, no chest pain no back pain abdominal pain no change in bowel habits complete ROS otherwise negative     Objective:   Physical Exam   Alert and oriented, vitals reviewed and stable, NAD ENT-TM's and ext canals WNL bilat via otoscopic exam Soft palate, tonsils and post pharynx WNL via oropharyngeal exam Neck-symmetric, no masses; thyroid nonpalpable and nontender Pulmonary-no tachypnea or accessory muscle use; Clear without wheezes via auscultation Card--no abnrml murmurs, rhythm reg and rate WNL Carotid pulses symmetric, without bruits      Assessment & Plan:  Impression 1 chronic anxiety.  Generalized anxiety disorder.  Much improved on medicine Ambien as well discussed  2.  Hyperlipidemia.  Mild elevation of bad cholesterol.  Discussed.  Diet discussed  3.  Erectile dysfunction.  Long discussion held.  This leads to substantial complications with relationships understandably.  Had use of brothers alive and well.  Side  effects benefits discussed medication initiated  Follow-up in 6 months.  Exercise encouraged.

## 2019-02-23 ENCOUNTER — Telehealth: Payer: Self-pay | Admitting: Family Medicine

## 2019-02-23 NOTE — Telephone Encounter (Signed)
Patient is requesting a referral to specialist for hernia he thinks he has again.He had hernia repair back in 2014 and states he has same symptoms as before. He wants to know if he needs to be seen here first or can we refer him out to Dr. Arnoldo Morale again. Please advise

## 2019-02-23 NOTE — Telephone Encounter (Signed)
Pt states he is having some discomfort around his umbilical area. Feels it more after eating or drinking. Started one week ago. No fever.  Same pain as before the surgery. Lost his father 2 and a half months ago to abdominal aneurysm so he would like to get checked as soon as possible. He works out of town mon - [employer] in [place] and gets back home on fridays around 3:30 but states he will get off work to get checked.

## 2019-02-24 NOTE — Telephone Encounter (Signed)
Appointment was taken by the time I got to message. Called pt. He is working late [employer] he said. I gave him number to  urgent care which is open on Saturday for him to call and advised him if worse go to ED. Pt verbalized understanding.

## 2019-02-24 NOTE — Telephone Encounter (Signed)
Currently there is a 350 appointment-when this message is handled if I am totally booked for the day this patient may have to consider going to urgent care with a follow-up visit please read below   please let the patient know that Dr. Richardson Landry is out this week  I could see him later today if he desires  so we could see the patient later today at 350-I recommend reserving that slot until you talk with the patient and find out if he can be there If he cannot be there then I recommend scheduling with Dr. Richardson Landry As for testing it really depends on what we find at the examination

## 2019-02-25 ENCOUNTER — Other Ambulatory Visit: Payer: Self-pay

## 2019-02-25 ENCOUNTER — Ambulatory Visit (HOSPITAL_COMMUNITY)
Admission: EM | Admit: 2019-02-25 | Discharge: 2019-02-25 | Disposition: A | Discharge status: Home / Self Care | Payer: 59 | Attending: Family Medicine | Admitting: Family Medicine

## 2019-02-25 ENCOUNTER — Encounter (HOSPITAL_COMMUNITY): Payer: Self-pay | Admitting: *Deleted

## 2019-02-25 DIAGNOSIS — R1033 Periumbilical pain: Secondary | ICD-10-CM | POA: Diagnosis not present

## 2019-02-25 NOTE — ED Triage Notes (Signed)
Reports umbilical hernia repair 5 yrs ago; over past week periumbilical area "doesn't feel right", especially after eating.  Denies any abd bulging.  Denies fever, n/v/d.

## 2019-02-25 NOTE — ED Provider Notes (Signed)
[facility]    CSN: [id number] Arrival date & time: 02/25/19  1559     History   Chief Complaint Chief Complaint  Patient presents with   Abdominal Pain    HPI Alan Barrera is a 43 y.o. male.   Patient presents with periumbilical pain which he describes as constant, dull pain with intermittent "pinpoint" sharp pains.  He states this is occurring over the area where he previously had umbilical hernia repair done 5 years ago.  He denies fever, chills, change in appetite, vomiting, diarrhea, dysuria, back pain, penile discharge, or testicular pain.  The history is provided by the patient.    Past Medical History:  Diagnosis Date   Anxiety     Patient Active Problem List   Diagnosis Date Noted   Erectile dysfunction 09/05/2018   Generalized anxiety disorder 03/30/2017    Past Surgical History:  Procedure Laterality Date   HERNIA REPAIR     INSERTION OF MESH N/A 06/02/2013   Procedure: INSERTION OF MESH;  Surgeon: Dalia HeadingMark A Jenkins, MD;  Location: [facility];  Service: General;  Laterality: N/A;   TONSILLECTOMY     UMBILICAL HERNIA REPAIR N/A 06/02/2013   Procedure: HERNIA REPAIR UMBILICAL ADULT;  Surgeon: Dalia HeadingMark A Jenkins, MD;  Location: [facility];  Service: General;  Laterality: N/A;       Home Medications    Prior to Admission medications   Medication Sig Start Date End Date Taking? Authorizing Provider  citalopram (CELEXA) 40 MG tablet Take 1 tablet daily. 09/05/18   Merlyn AlbertLuking, William S, MD  sildenafil (REVATIO) 20 MG tablet Take 3 tablets po 1 -2 hours prior to sex 09/05/18   Merlyn AlbertLuking, William S, MD    Family History Family History  Problem Relation Age of Onset   Healthy Mother    AAA (abdominal aortic aneurysm) Father    Hernia Brother     Social History Social History   Tobacco Use   Smoking status: Never Smoker   Smokeless tobacco: Current User    Types: Chew  Substance Use Topics   Alcohol use: No   Drug use: Never      Allergies   Hydrocodone   Review of Systems Review of Systems  Constitutional: Negative for chills and fever.  HENT: Negative for ear pain and sore throat.   Eyes: Negative for pain and visual disturbance.  Respiratory: Negative for cough and shortness of breath.   Cardiovascular: Negative for chest pain and palpitations.  Gastrointestinal: Positive for abdominal pain. Negative for diarrhea, nausea and vomiting.  Genitourinary: Negative for discharge, dysuria, flank pain, hematuria and testicular pain.  Musculoskeletal: Negative for arthralgias and back pain.  Skin: Negative for color change and rash.  Neurological: Negative for seizures and syncope.  All other systems reviewed and are negative.    Physical Exam Triage Vital Signs ED Triage Vitals [02/25/19 1619]  Enc Vitals Group     BP 135/85     Pulse Rate 68     Resp 16     Temp 98.1 F (36.7 C)     Temp Source Oral     SpO2 99 %     Weight      Height      Head Circumference      Peak Flow      Pain Score 2     Pain Loc      Pain Edu?      Excl. in GC?    No data found.  Updated Vital Signs BP 135/85    Pulse 68    Temp 98.1 F (36.7 C) (Oral)    Resp 16    SpO2 99%   Visual Acuity Right Eye Distance:   Left Eye Distance:   Bilateral Distance:    Right Eye Near:   Left Eye Near:    Bilateral Near:     Physical Exam Vitals signs and nursing note reviewed.  Constitutional:      Appearance: He is well-developed.  HENT:     Head: Normocephalic and atraumatic.     Mouth/Throat:     Mouth: Mucous membranes are moist.  Eyes:     Conjunctiva/sclera: Conjunctivae normal.  Neck:     Musculoskeletal: Neck supple.  Cardiovascular:     Rate and Rhythm: Normal rate and regular rhythm.     Heart sounds: Normal heart sounds. No murmur.  Pulmonary:     Effort: Pulmonary effort is normal. No respiratory distress.     Breath sounds: Normal breath sounds.  Abdominal:     General: Bowel sounds are  normal. There is no distension.     Palpations: Abdomen is soft. There is no mass.     Tenderness: There is no abdominal tenderness. There is no right CVA tenderness, left CVA tenderness, guarding or rebound.  Skin:    General: Skin is warm and dry.  Neurological:     Mental Status: He is alert.      UC Treatments / Results  Labs (all labs ordered are listed, but only abnormal results are displayed) Labs Reviewed - No data to display  EKG   Radiology No results found.  Procedures Procedures (including critical care time)  Medications Ordered in UC Medications - No data to display  Initial Impression / Assessment and Plan / UC Course  I have reviewed the triage vital signs and the nursing notes.  Pertinent labs & imaging results that were available during my care of the patient were reviewed by me and considered in my medical decision making (see chart for details).   Abdominal pain, periumbilical.  Exam does not indicate an acute abdomen or hernia at this time.  Discussed with patient that he should follow-up with the surgeon who performed his hernia repair on Monday for evaluation.  Strict instructions given to patient to go to the emergency department if his abdominal pain worsens or has symptoms of incarceration or develops fever, chills, vomiting or other concerning symptoms; patient agrees.     Final Clinical Impressions(s) / UC Diagnoses   Final diagnoses:  Periumbilical abdominal pain     Discharge Instructions     Follow-up with the surgeon who did your hernia repair, Dr. Aviva Signs.    Go to the emergency room if your pain worsens; if you develop a tender, red, firm mass where your hernia was, if you develop a fever, chills, vomiting, or other concerning symptoms.       ED Prescriptions    None     Controlled Substance Prescriptions [place] Controlled Substance Registry consulted? Not Applicable   Sharion Balloon, NP 02/25/19 1756

## 2019-02-25 NOTE — Discharge Instructions (Addendum)
Follow-up with the surgeon who did your hernia repair, Dr. Aviva Signs.    Go to the emergency room if your pain worsens; if you develop a tender, red, firm mass where your hernia was, if you develop a fever, chills, vomiting, or other concerning symptoms.

## 2019-02-27 ENCOUNTER — Ambulatory Visit: Payer: 59 | Admitting: Family Medicine

## 2019-03-21 ENCOUNTER — Ambulatory Visit: Payer: 59 | Admitting: General Surgery

## 2019-03-30 ENCOUNTER — Ambulatory Visit: Payer: 59 | Admitting: General Surgery

## 2019-04-13 ENCOUNTER — Other Ambulatory Visit: Payer: Self-pay

## 2019-04-13 ENCOUNTER — Ambulatory Visit (INDEPENDENT_AMBULATORY_CARE_PROVIDER_SITE_OTHER): Payer: 59 | Admitting: General Surgery

## 2019-04-13 ENCOUNTER — Encounter: Payer: Self-pay | Admitting: General Surgery

## 2019-04-13 VITALS — BP 139/89 | HR 64 | Temp 97.7°F | Resp 16 | Ht 71.0 in | Wt 179.0 lb

## 2019-04-13 DIAGNOSIS — K439 Ventral hernia without obstruction or gangrene: Secondary | ICD-10-CM | POA: Diagnosis not present

## 2019-04-13 NOTE — Patient Instructions (Signed)
Ventral Hernia  A ventral hernia is a bulge of tissue from inside the abdomen that pushes through a weak area of the muscles that form the front wall of the abdomen. The tissues inside the abdomen are inside a sac (peritoneum). These tissues include the small intestine, large intestine, and the fatty tissue that covers the intestines (omentum). Sometimes, the bulge that forms a hernia contains intestines. Other hernias contain only fat. Ventral hernias do not go away without surgical treatment. There are several types of ventral hernias. You may have:  A hernia at an incision site from previous abdominal surgery (incisional hernia).  A hernia just above the belly button (epigastric hernia), or at the belly button (umbilical hernia). These types of hernias can develop from heavy lifting or straining.  A hernia that comes and goes (reducible hernia). It may be visible only when you lift or strain. This type of hernia can be pushed back into the abdomen (reduced).  A hernia that traps abdominal tissue inside the hernia (incarcerated hernia). This type of hernia does not reduce.  A hernia that cuts off blood flow to the tissues inside the hernia (strangulated hernia). The tissues can start to die if this happens. This is a very painful bulge that cannot be reduced. A strangulated hernia is a medical emergency. What are the causes? This condition is caused by abdominal tissue putting pressure on an area of weakness in the abdominal muscles. What increases the risk? The following factors may make you more likely to develop this condition:  Being male.  Being 60 or older.  Being overweight or obese.  Having had previous abdominal surgery, especially if there was an infection after surgery.  Having had an injury to the abdominal wall.  Having had several pregnancies.  Having a buildup of fluid inside the abdomen (ascites). What are the signs or symptoms? The only symptom of a ventral hernia  may be a painless bulge in the abdomen. A reducible hernia may be visible only when you strain, cough, or lift. Other symptoms may include:  Dull pain.  A feeling of pressure. Signs and symptoms of a strangulated hernia may include:  Increasing pain.  Nausea and vomiting.  Pain when pressing on the hernia.  The skin over the hernia turning red or purple.  Constipation.  Blood in the stool (feces). How is this diagnosed? This condition may be diagnosed based on:  Your symptoms.  Your medical history.  A physical exam. You may be asked to cough or strain while standing. These actions increase the pressure inside your abdomen and force the hernia through the opening in your muscles. Your health care provider may try to reduce the hernia by pressing on it.  Imaging studies, such as an ultrasound or CT scan. How is this treated? This condition is treated with surgery. If you have a strangulated hernia, surgery is done as soon as possible. If your hernia is small and not incarcerated, you may be asked to lose some weight before surgery. Follow these instructions at home:  Follow instructions from your health care provider about eating or drinking restrictions.  If you are overweight, your health care provider may recommend that you increase your activity level and eat a healthier diet.  Do not lift anything that is heavier than 10 lb (4.5 kg).  Return to your normal activities as told by your health care provider. Ask your health care provider what activities are safe for you. You may need to avoid activities   that increase pressure on your hernia.  Take over-the-counter and prescription medicines only as told by your health care provider.  Keep all follow-up visits as told by your health care provider. This is important. Contact a health care provider if:  Your hernia gets larger.  Your hernia becomes painful. Get help right away if:  Your hernia becomes increasingly  painful.  You have pain along with any of the following: ? Changes in skin color in the area of the hernia. ? Nausea. ? Vomiting. ? Fever. Summary  A ventral hernia is a bulge of tissue from inside the abdomen that pushes through a weak area of the muscles that form the front wall of the abdomen.  This condition is treated with surgery, which may be urgent depending on your hernia.  Do not lift anything that is heavier than 10 lb (4.5 kg), and follow activity instructions from your health care provider. This information is not intended to replace advice given to you by your health care provider. Make sure you discuss any questions you have with your health care provider. Document Released: 06/29/2012 Document Revised: 08/25/2017 Document Reviewed: 02/01/2017 Elsevier Patient Education  2020 [employer]  

## 2019-04-13 NOTE — Progress Notes (Signed)
Alan Barrera; [id number]; 05/29/1976   HPI Patient is a 43 year old white male who was referred to my care by Dr. Baltazar Apo for evaluation treatment of abdominal discomfort and a possible hernia of the abdominal wall.  Patient is status post an umbilical herniorrhaphy with mesh in 2014 by myself.  He states he had been doing well until the last few months when he noticed a swelling above his umbilicus along the midline.  It is occasionally sore to touch and he feels something poking out.  Is made worse with straining.  He denies any nausea or vomiting.  He currently has a pain level of 2 out of 10. Past Medical History:  Diagnosis Date  . Anxiety     Past Surgical History:  Procedure Laterality Date  . HERNIA REPAIR    . INSERTION OF MESH N/A 06/02/2013   Procedure: INSERTION OF MESH;  Surgeon: Jamesetta So, MD;  Location: [facility];  Service: General;  Laterality: N/A;  . TONSILLECTOMY    . UMBILICAL HERNIA REPAIR N/A 06/02/2013   Procedure: HERNIA REPAIR UMBILICAL ADULT;  Surgeon: Jamesetta So, MD;  Location: [facility];  Service: General;  Laterality: N/A;    Family History  Problem Relation Age of Onset  . Healthy Mother   . AAA (abdominal aortic aneurysm) Father   . Hernia Brother     Current Outpatient Medications on File Prior to Visit  Medication Sig Dispense Refill  . citalopram (CELEXA) 40 MG tablet Take 1 tablet daily. 30 tablet 5  . sildenafil (REVATIO) 20 MG tablet Take 3 tablets po 1 -2 hours prior to sex 24 tablet 5   No current facility-administered medications on file prior to visit.     Allergies  Allergen Reactions  . Hydrocodone     Pt states it makes him sick    Social History   Substance and Sexual Activity  Alcohol Use No    Social History   Tobacco Use  Smoking Status Never Smoker  Smokeless Tobacco Current User  . Types: Chew    Review of Systems  Constitutional: Negative.   HENT: Negative.   Eyes: Negative.   Respiratory:  Negative.   Cardiovascular: Negative.   Gastrointestinal: Positive for abdominal pain.  Genitourinary: Negative.   Musculoskeletal: Negative.   Skin: Negative.   Neurological: Negative.   Endo/Heme/Allergies: Negative.   Psychiatric/Behavioral: Negative.     Objective   Vitals:   04/13/19 0928  BP: 139/89  Pulse: 64  Resp: 16  Temp: 97.7 F (36.5 C)  SpO2: 96%    Physical Exam Vitals signs reviewed.  Constitutional:      Appearance: He is well-developed and normal weight. He is not ill-appearing.  HENT:     Head: Normocephalic and atraumatic.  Cardiovascular:     Rate and Rhythm: Normal rate and regular rhythm.     Heart sounds: Normal heart sounds. No murmur. No friction rub. No gallop.   Pulmonary:     Effort: Pulmonary effort is normal. No respiratory distress.     Breath sounds: Normal breath sounds. No stridor. No wheezing or rales.  Abdominal:     General: Abdomen is flat. A surgical scar is present. Bowel sounds are normal. There is no distension.     Palpations: Abdomen is soft.     Hernia: A hernia is present. There is no hernia in the umbilical area.     Comments: Small reducible hernia along the midline superior to the umbilicus.  Skin:  General: Skin is warm and dry.  Neurological:     Mental Status: He is alert and oriented to person, place, and time.   Primary care notes reviewed  Assessment   Ventral hernia.  This appears to be outside the previously repaired umbilical hernia.  Plan   Patient will call to schedule ventral herniorrhaphy with mesh.  The risks and benefits of the procedure including bleeding, infection, mesh use, and the possible recurrence of the hernia were fully explained to the patient, who gave informed consent.

## 2019-08-27 ENCOUNTER — Other Ambulatory Visit: Payer: Self-pay | Admitting: Family Medicine

## 2019-08-28 ENCOUNTER — Encounter: Payer: Self-pay | Admitting: Family Medicine

## 2019-08-28 NOTE — Telephone Encounter (Signed)
Please contact patient to schedule appt, then may route back to nurses. Thank you

## 2019-08-28 NOTE — Telephone Encounter (Signed)
Scheduled 2/8

## 2019-09-04 ENCOUNTER — Other Ambulatory Visit: Payer: Self-pay

## 2019-09-04 ENCOUNTER — Ambulatory Visit (INDEPENDENT_AMBULATORY_CARE_PROVIDER_SITE_OTHER): Payer: Managed Care, Other (non HMO) | Admitting: Family Medicine

## 2019-09-04 DIAGNOSIS — F411 Generalized anxiety disorder: Secondary | ICD-10-CM

## 2019-09-04 DIAGNOSIS — N5201 Erectile dysfunction due to arterial insufficiency: Secondary | ICD-10-CM

## 2019-09-04 MED ORDER — SILDENAFIL CITRATE 20 MG PO TABS: ORAL_TABLET | ORAL | 11 refills | Status: DC

## 2019-09-04 MED ORDER — CITALOPRAM HYDROBROMIDE 40 MG PO TABS: ORAL_TABLET | ORAL | 3 refills | Status: DC

## 2019-09-04 NOTE — Progress Notes (Signed)
   Subjective:  Audio  Patient ID: Alan Barrera, male    DOB: 01-Jan-1976, 44 y.o.   MRN: [id number]  HPI Pt needing med check. Pt is taking Celexa 40 mg daily. Pt states no issues.  Virtual Visit via Telephone Note  I connected with Alan Barrera on 09/04/19 at  8:30 AM EST by telephone and verified that I am speaking with the correct person using two identifiers.  Location: Patient: home Provider: office   I discussed the limitations, risks, security and privacy concerns of performing an evaluation and management service by telephone and the availability of in person appointments. I also discussed with the patient that there may be a patient responsible charge related to this service. The patient expressed understanding and agreed to proceed.   History of Present Illness:    Observations/Objective:   Assessment and Plan:   Follow Up Instructions:    I discussed the assessment and treatment plan with the patient. The patient was provided an opportunity to ask questions and all were answered. The patient agreed with the plan and demonstrated an understanding of the instructions.   The patient was advised to call back or seek an in-person evaluation if the symptoms worsen or if the condition fails to improve as anticipated.  I provided 22 minutes of non-face-to-face time during this encounter.   Patient states tolerating Celexa very well.  No obvious side effects.  Compliant with medication.  Definitely helps his anxiety.  Definitely wants to stay on medication.  Continues to use the generic Viagra.  Also beneficial.  Requests to maintain on medications  Working full-time.  Trying to keep his distance from the virus.    Review of Systems No headache no chest pain no shortness of breath    Objective:   Physical Exam   Virtual     Assessment & Plan:  Impression generalized anxiety disorder.  Clinically stable discussed to maintain same therapy compliance  discussed  2.  Erectile dysfunction.  Ongoing medications refilled  1 years worth of medicines prescribed symptom care discussed

## 2019-09-21 ENCOUNTER — Other Ambulatory Visit: Payer: Self-pay

## 2019-09-21 ENCOUNTER — Ambulatory Visit (INDEPENDENT_AMBULATORY_CARE_PROVIDER_SITE_OTHER): Payer: Managed Care, Other (non HMO) | Admitting: General Surgery

## 2019-09-21 ENCOUNTER — Encounter: Payer: Self-pay | Admitting: General Surgery

## 2019-09-21 VITALS — BP 131/83 | HR 79 | Temp 97.7°F | Resp 12 | Ht 71.0 in | Wt 172.2 lb

## 2019-09-21 DIAGNOSIS — K439 Ventral hernia without obstruction or gangrene: Secondary | ICD-10-CM

## 2019-09-21 NOTE — H&P (Signed)
Alan Barrera; [id number]; 01/24/1976   HPI Patient is a 43-year-old white male who was referred to my care by Dr. William Luking for evaluation treatment of abdominal discomfort and a possible hernia of the abdominal wall.  Patient is status post an umbilical herniorrhaphy with mesh in 2014 by myself.  He states he had been doing well until the last few months when he noticed a swelling above his umbilicus along the midline.  It is occasionally sore to touch and he feels something poking out.  Is made worse with straining.  He denies any nausea or vomiting.  He currently has a pain level of 2 out of 10. Past Medical History:  Diagnosis Date  . Anxiety     Past Surgical History:  Procedure Laterality Date  . HERNIA REPAIR    . INSERTION OF MESH N/A 06/02/2013   Procedure: INSERTION OF MESH;  Surgeon: Abigael Mogle A Zeke Aker, MD;  Location: [facility];  Service: General;  Laterality: N/A;  . TONSILLECTOMY    . UMBILICAL HERNIA REPAIR N/A 06/02/2013   Procedure: HERNIA REPAIR UMBILICAL ADULT;  Surgeon: Anhthu Perdew A Katya Rolston, MD;  Location: [facility];  Service: General;  Laterality: N/A;    Family History  Problem Relation Age of Onset  . Healthy Mother   . AAA (abdominal aortic aneurysm) Father   . Hernia Brother     Current Outpatient Medications on File Prior to Visit  Medication Sig Dispense Refill  . citalopram (CELEXA) 40 MG tablet Take 1 tablet daily. 30 tablet 5  . sildenafil (REVATIO) 20 MG tablet Take 3 tablets po 1 -2 hours prior to sex 24 tablet 5   No current facility-administered medications on file prior to visit.     Allergies  Allergen Reactions  . Hydrocodone     Pt states it makes him sick    Social History   Substance and Sexual Activity  Alcohol Use No    Social History   Tobacco Use  Smoking Status Never Smoker  Smokeless Tobacco Current User  . Types: Chew    Review of Systems  Constitutional: Negative.   HENT: Negative.   Eyes: Negative.   Respiratory:  Negative.   Cardiovascular: Negative.   Gastrointestinal: Positive for abdominal pain.  Genitourinary: Negative.   Musculoskeletal: Negative.   Skin: Negative.   Neurological: Negative.   Endo/Heme/Allergies: Negative.   Psychiatric/Behavioral: Negative.     Objective   Vitals:   04/13/19 0928  BP: 139/89  Pulse: 64  Resp: 16  Temp: 97.7 F (36.5 C)  SpO2: 96%    Physical Exam Vitals signs reviewed.  Constitutional:      Appearance: He is well-developed and normal weight. He is not ill-appearing.  HENT:     Head: Normocephalic and atraumatic.  Cardiovascular:     Rate and Rhythm: Normal rate and regular rhythm.     Heart sounds: Normal heart sounds. No murmur. No friction rub. No gallop.   Pulmonary:     Effort: Pulmonary effort is normal. No respiratory distress.     Breath sounds: Normal breath sounds. No stridor. No wheezing or rales.  Abdominal:     General: Abdomen is flat. A surgical scar is present. Bowel sounds are normal. There is no distension.     Palpations: Abdomen is soft.     Hernia: A hernia is present. There is no hernia in the umbilical area.     Comments: Small reducible hernia along the midline superior to the umbilicus.  Skin:      General: Skin is warm and dry.  Neurological:     Mental Status: He is alert and oriented to person, place, and time.   Primary care notes reviewed  Assessment   Ventral hernia.  This appears to be outside the previously repaired umbilical hernia.  Plan   Patient will call to schedule ventral herniorrhaphy with mesh.  The risks and benefits of the procedure including bleeding, infection, mesh use, and the possible recurrence of the hernia were fully explained to the patient, who gave informed consent.

## 2019-09-21 NOTE — Patient Instructions (Signed)
Ventral Hernia  A ventral hernia is a bulge of tissue from inside the abdomen that pushes through a weak area of the muscles that form the front wall of the abdomen. The tissues inside the abdomen are inside a sac (peritoneum). These tissues include the small intestine, large intestine, and the fatty tissue that covers the intestines (omentum). Sometimes, the bulge that forms a hernia contains intestines. Other hernias contain only fat. Ventral hernias do not go away without surgical treatment. There are several types of ventral hernias. You may have:  A hernia at an incision site from previous abdominal surgery (incisional hernia).  A hernia just above the belly button (epigastric hernia), or at the belly button (umbilical hernia). These types of hernias can develop from heavy lifting or straining.  A hernia that comes and goes (reducible hernia). It may be visible only when you lift or strain. This type of hernia can be pushed back into the abdomen (reduced).  A hernia that traps abdominal tissue inside the hernia (incarcerated hernia). This type of hernia does not reduce.  A hernia that cuts off blood flow to the tissues inside the hernia (strangulated hernia). The tissues can start to die if this happens. This is a very painful bulge that cannot be reduced. A strangulated hernia is a medical emergency. What are the causes? This condition is caused by abdominal tissue putting pressure on an area of weakness in the abdominal muscles. What increases the risk? The following factors may make you more likely to develop this condition:  Being male.  Being 60 or older.  Being overweight or obese.  Having had previous abdominal surgery, especially if there was an infection after surgery.  Having had an injury to the abdominal wall.  Having had several pregnancies.  Having a buildup of fluid inside the abdomen (ascites). What are the signs or symptoms? The only symptom of a ventral hernia  may be a painless bulge in the abdomen. A reducible hernia may be visible only when you strain, cough, or lift. Other symptoms may include:  Dull pain.  A feeling of pressure. Signs and symptoms of a strangulated hernia may include:  Increasing pain.  Nausea and vomiting.  Pain when pressing on the hernia.  The skin over the hernia turning red or purple.  Constipation.  Blood in the stool (feces). How is this diagnosed? This condition may be diagnosed based on:  Your symptoms.  Your medical history.  A physical exam. You may be asked to cough or strain while standing. These actions increase the pressure inside your abdomen and force the hernia through the opening in your muscles. Your health care provider may try to reduce the hernia by pressing on it.  Imaging studies, such as an ultrasound or CT scan. How is this treated? This condition is treated with surgery. If you have a strangulated hernia, surgery is done as soon as possible. If your hernia is small and not incarcerated, you may be asked to lose some weight before surgery. Follow these instructions at home:  Follow instructions from your health care provider about eating or drinking restrictions.  If you are overweight, your health care provider may recommend that you increase your activity level and eat a healthier diet.  Do not lift anything that is heavier than 10 lb (4.5 kg).  Return to your normal activities as told by your health care provider. Ask your health care provider what activities are safe for you. You may need to avoid activities   that increase pressure on your hernia.  Take over-the-counter and prescription medicines only as told by your health care provider.  Keep all follow-up visits as told by your health care provider. This is important. Contact a health care provider if:  Your hernia gets larger.  Your hernia becomes painful. Get help right away if:  Your hernia becomes increasingly  painful.  You have pain along with any of the following: ? Changes in skin color in the area of the hernia. ? Nausea. ? Vomiting. ? Fever. Summary  A ventral hernia is a bulge of tissue from inside the abdomen that pushes through a weak area of the muscles that form the front wall of the abdomen.  This condition is treated with surgery, which may be urgent depending on your hernia.  Do not lift anything that is heavier than 10 lb (4.5 kg), and follow activity instructions from your health care provider. This information is not intended to replace advice given to you by your health care provider. Make sure you discuss any questions you have with your health care provider. Document Revised: 08/25/2017 Document Reviewed: 02/01/2017 Elsevier Patient Education  2020 [employer] Inc[employer]  

## 2019-09-21 NOTE — Progress Notes (Signed)
Subjective:     Alan Barrera  Patient presents back to schedule surgery.  I last saw him in September 2020 for ventral hernia.  This was just above his previous umbilical herniorrhaphy.  He states that it seems to be worsening and would like to have it fixed. Objective:    BP 131/83   Pulse 79   Temp 97.7 F (36.5 C) (Oral)   Resp 12   Ht 5\' 11"  (1.803 m)   Wt 172 lb 3.2 oz (78.1 kg)   SpO2 98%   BMI 24.02 kg/m   General:  alert, cooperative and no distress  Head is normocephalic, atraumatic Lungs clear to auscultation with good breath sounds bilaterally Heart examination reveals regular rate and rhythm without S3, S4, murmurs Abdomen is soft and flat.  He does have a hernia defect superior to the umbilicus along the midline.  It is reducible.  No inguinal hernias are identified.     Assessment:    Ventral hernia    Plan:   Patient is scheduled for ventral herniorrhaphy with mesh on 09/25/2019.  The risks and benefits of the procedure including bleeding, infection, mesh use, the possibility of recurrence of the hernia were fully explained to the patient, who gave informed consent.  He has been complaining of some constipation and I told him that this was probably unrelated to the hernia.

## 2019-09-22 ENCOUNTER — Other Ambulatory Visit (HOSPITAL_COMMUNITY)
Admission: RE | Admit: 2019-09-22 | Discharge: 2019-09-22 | Disposition: A | Discharge status: Home / Self Care | Payer: Managed Care, Other (non HMO) | Source: Ambulatory Visit | Attending: General Surgery | Admitting: General Surgery

## 2019-09-22 ENCOUNTER — Other Ambulatory Visit: Payer: Self-pay

## 2019-09-22 ENCOUNTER — Encounter (HOSPITAL_COMMUNITY): Payer: Self-pay

## 2019-09-22 ENCOUNTER — Encounter (HOSPITAL_COMMUNITY)
Admission: RE | Admit: 2019-09-22 | Discharge: 2019-09-22 | Disposition: A | Discharge status: Home / Self Care | Payer: Managed Care, Other (non HMO) | Source: Ambulatory Visit | Attending: General Surgery | Admitting: General Surgery

## 2019-09-22 DIAGNOSIS — Z20822 Contact with and (suspected) exposure to covid-19: Secondary | ICD-10-CM | POA: Diagnosis not present

## 2019-09-22 DIAGNOSIS — Z01812 Encounter for preprocedural laboratory examination: Secondary | ICD-10-CM | POA: Diagnosis not present

## 2019-09-22 HISTORY — DX: Unspecified osteoarthritis, unspecified site: M19.90

## 2019-09-22 LAB — SARS CORONAVIRUS 2 (TAT 6-24 HRS): SARS Coronavirus 2: NEGATIVE

## 2019-09-25 ENCOUNTER — Encounter (HOSPITAL_COMMUNITY): Payer: Self-pay | Admitting: General Surgery

## 2019-09-25 ENCOUNTER — Ambulatory Visit (HOSPITAL_COMMUNITY): Payer: Managed Care, Other (non HMO) | Admitting: Anesthesiology

## 2019-09-25 ENCOUNTER — Other Ambulatory Visit: Payer: Self-pay

## 2019-09-25 ENCOUNTER — Encounter (HOSPITAL_COMMUNITY)
Admission: RE | Disposition: A | Discharge status: Home / Self Care | Payer: Self-pay | Source: Home / Self Care | Attending: General Surgery

## 2019-09-25 ENCOUNTER — Ambulatory Visit (HOSPITAL_COMMUNITY)
Admission: RE | Admit: 2019-09-25 | Discharge: 2019-09-25 | Disposition: A | Discharge status: Home / Self Care | Payer: Managed Care, Other (non HMO) | Attending: General Surgery | Admitting: General Surgery

## 2019-09-25 DIAGNOSIS — Z79899 Other long term (current) drug therapy: Secondary | ICD-10-CM | POA: Diagnosis not present

## 2019-09-25 DIAGNOSIS — F419 Anxiety disorder, unspecified: Secondary | ICD-10-CM | POA: Insufficient documentation

## 2019-09-25 DIAGNOSIS — E119 Type 2 diabetes mellitus without complications: Secondary | ICD-10-CM | POA: Diagnosis not present

## 2019-09-25 DIAGNOSIS — K439 Ventral hernia without obstruction or gangrene: Secondary | ICD-10-CM

## 2019-09-25 HISTORY — PX: VENTRAL HERNIA REPAIR: SHX424

## 2019-09-25 SURGERY — REPAIR, HERNIA, VENTRAL
Anesthesia: General | Site: Abdomen

## 2019-09-25 MED ORDER — PROMETHAZINE HCL 25 MG/ML IJ SOLN: 6.2500 mg | INTRAMUSCULAR | Status: DC | PRN

## 2019-09-25 MED ORDER — ONDANSETRON HCL 4 MG/2ML IJ SOLN
INTRAMUSCULAR | Status: DC | PRN
  Administered 2019-09-25: 4 mg via INTRAVENOUS

## 2019-09-25 MED ORDER — MIDAZOLAM HCL 2 MG/2ML IJ SOLN
INTRAMUSCULAR | Status: AC
  Filled 2019-09-25: qty 2

## 2019-09-25 MED ORDER — MEPERIDINE HCL 50 MG/ML IJ SOLN: 6.2500 mg | INTRAMUSCULAR | Status: DC | PRN

## 2019-09-25 MED ORDER — EPHEDRINE 5 MG/ML INJ
INTRAVENOUS | Status: AC
  Filled 2019-09-25: qty 10

## 2019-09-25 MED ORDER — OXYCODONE-ACETAMINOPHEN 7.5-325 MG PO TABS: 1.0000 | ORAL_TABLET | Freq: Four times a day (QID) | ORAL | 0 refills | Status: DC | PRN

## 2019-09-25 MED ORDER — FENTANYL CITRATE (PF) 250 MCG/5ML IJ SOLN
INTRAMUSCULAR | Status: AC
  Filled 2019-09-25: qty 5

## 2019-09-25 MED ORDER — LIDOCAINE HCL (CARDIAC) PF 100 MG/5ML IV SOSY
PREFILLED_SYRINGE | INTRAVENOUS | Status: DC | PRN
  Administered 2019-09-25: 50 mg via INTRAVENOUS

## 2019-09-25 MED ORDER — DEXAMETHASONE SODIUM PHOSPHATE 10 MG/ML IJ SOLN
INTRAMUSCULAR | Status: AC
  Filled 2019-09-25: qty 1

## 2019-09-25 MED ORDER — CEFAZOLIN SODIUM-DEXTROSE 2-4 GM/100ML-% IV SOLN
2.0000 g | INTRAVENOUS | Status: AC
  Administered 2019-09-25: 10:00:00 2 g via INTRAVENOUS
  Filled 2019-09-25: qty 100

## 2019-09-25 MED ORDER — LACTATED RINGERS IV SOLN: INTRAVENOUS | Status: DC | PRN

## 2019-09-25 MED ORDER — CHLORHEXIDINE GLUCONATE CLOTH 2 % EX PADS: 6.0000 | MEDICATED_PAD | Freq: Once | CUTANEOUS | Status: DC

## 2019-09-25 MED ORDER — HYDROMORPHONE HCL 1 MG/ML IJ SOLN
0.2500 mg | INTRAMUSCULAR | Status: DC | PRN
  Administered 2019-09-25: 0.5 mg via INTRAVENOUS

## 2019-09-25 MED ORDER — POVIDONE-IODINE 10 % OINT PACKET: TOPICAL_OINTMENT | CUTANEOUS | Status: DC | PRN

## 2019-09-25 MED ORDER — MIDAZOLAM HCL 2 MG/2ML IJ SOLN
2.0000 mg | Freq: Once | INTRAMUSCULAR | Status: AC
  Administered 2019-09-25: 2 mg via INTRAVENOUS

## 2019-09-25 MED ORDER — FENTANYL CITRATE (PF) 100 MCG/2ML IJ SOLN
INTRAMUSCULAR | Status: DC | PRN
  Administered 2019-09-25 (×2): 50 ug via INTRAVENOUS

## 2019-09-25 MED ORDER — KETOROLAC TROMETHAMINE 30 MG/ML IJ SOLN
INTRAMUSCULAR | Status: AC
  Filled 2019-09-25: qty 1

## 2019-09-25 MED ORDER — 0.9 % SODIUM CHLORIDE (POUR BTL) OPTIME
TOPICAL | Status: DC | PRN
  Administered 2019-09-25: 1000 mL

## 2019-09-25 MED ORDER — BUPIVACAINE LIPOSOME 1.3 % IJ SUSP
INTRAMUSCULAR | Status: AC
  Filled 2019-09-25: qty 20

## 2019-09-25 MED ORDER — POVIDONE-IODINE 10 % EX OINT
TOPICAL_OINTMENT | CUTANEOUS | Status: AC
  Filled 2019-09-25: qty 1

## 2019-09-25 MED ORDER — GLYCOPYRROLATE 0.2 MG/ML IJ SOLN
INTRAMUSCULAR | Status: DC | PRN
  Administered 2019-09-25: .2 mg via INTRAVENOUS

## 2019-09-25 MED ORDER — EPHEDRINE SULFATE 50 MG/ML IJ SOLN
INTRAMUSCULAR | Status: DC | PRN
  Administered 2019-09-25: 10 mg via INTRAVENOUS

## 2019-09-25 MED ORDER — PROPOFOL 10 MG/ML IV BOLUS
INTRAVENOUS | Status: DC | PRN
  Administered 2019-09-25: 200 mg via INTRAVENOUS

## 2019-09-25 MED ORDER — GLYCOPYRROLATE 0.2 MG/ML IJ SOLN
INTRAMUSCULAR | Status: AC
  Filled 2019-09-25: qty 1

## 2019-09-25 MED ORDER — LACTATED RINGERS IV SOLN: Freq: Once | INTRAVENOUS | Status: AC

## 2019-09-25 MED ORDER — HYDROMORPHONE HCL 1 MG/ML IJ SOLN
INTRAMUSCULAR | Status: AC
  Filled 2019-09-25: qty 0.5

## 2019-09-25 MED ORDER — BUPIVACAINE LIPOSOME 1.3 % IJ SUSP
INTRAMUSCULAR | Status: DC | PRN
  Administered 2019-09-25: 15 mL

## 2019-09-25 MED ORDER — KETOROLAC TROMETHAMINE 30 MG/ML IJ SOLN
30.0000 mg | Freq: Once | INTRAMUSCULAR | Status: AC
  Administered 2019-09-25: 30 mg via INTRAVENOUS

## 2019-09-25 SURGICAL SUPPLY — 41 items
CHLORAPREP W/TINT 26 (MISCELLANEOUS) ×2 IMPLANT
CLOTH BEACON ORANGE TIMEOUT ST (SAFETY) ×2 IMPLANT
COVER LIGHT HANDLE STERIS (MISCELLANEOUS) ×4 IMPLANT
COVER WAND RF STERILE (DRAPES) ×2 IMPLANT
DERMABOND ADVANCED (GAUZE/BANDAGES/DRESSINGS) ×1
DERMABOND ADVANCED .7 DNX12 (GAUZE/BANDAGES/DRESSINGS) ×1 IMPLANT
ELECT REM PT RETURN 9FT ADLT (ELECTROSURGICAL) ×2
ELECTRODE REM PT RTRN 9FT ADLT (ELECTROSURGICAL) ×1 IMPLANT
GAUZE SPONGE 4X4 12PLY STRL (GAUZE/BANDAGES/DRESSINGS) ×2 IMPLANT
GLOVE BIO SURGEON STRL SZ7 (GLOVE) ×2 IMPLANT
GLOVE BIOGEL PI IND STRL 6.5 (GLOVE) ×2 IMPLANT
GLOVE BIOGEL PI IND STRL 7.0 (GLOVE) ×3 IMPLANT
GLOVE BIOGEL PI INDICATOR 6.5 (GLOVE) ×2
GLOVE BIOGEL PI INDICATOR 7.0 (GLOVE) ×3
GLOVE ECLIPSE 6.5 STRL STRAW (GLOVE) ×2 IMPLANT
GLOVE SURG SS PI 7.5 STRL IVOR (GLOVE) ×2 IMPLANT
GOWN STRL REUS W/ TWL LRG LVL3 (GOWN DISPOSABLE) ×1 IMPLANT
GOWN STRL REUS W/TWL LRG LVL3 (GOWN DISPOSABLE) ×7 IMPLANT
INST SET MINOR GENERAL (KITS) ×2 IMPLANT
KIT TURNOVER KIT A (KITS) ×2 IMPLANT
MANIFOLD NEPTUNE II (INSTRUMENTS) ×2 IMPLANT
MESH VENTRALEX ST 1-7/10 CRC S (Mesh General) ×2 IMPLANT
NEEDLE HYPO 18GX1.5 BLUNT FILL (NEEDLE) ×2 IMPLANT
NEEDLE HYPO 22GX1.5 SAFETY (NEEDLE) ×2 IMPLANT
NS IRRIG 1000ML POUR BTL (IV SOLUTION) ×2 IMPLANT
PACK MINOR (CUSTOM PROCEDURE TRAY) ×2 IMPLANT
PAD ARMBOARD 7.5X6 YLW CONV (MISCELLANEOUS) ×2 IMPLANT
SET BASIN LINEN APH (SET/KITS/TRAYS/PACK) ×2 IMPLANT
STAPLER VISISTAT (STAPLE) ×2 IMPLANT
SUT ETHIBOND NAB MO 7 #0 18IN (SUTURE) ×2 IMPLANT
SUT MNCRL AB 4-0 PS2 18 (SUTURE) ×2 IMPLANT
SUT NOVA NAB GS-21 1 T12 (SUTURE) IMPLANT
SUT NOVA NAB GS-22 2 2-0 T-19 (SUTURE) IMPLANT
SUT NOVA NAB GS-26 0 60 (SUTURE) IMPLANT
SUT SILK 2 0 (SUTURE)
SUT SILK 2-0 18XBRD TIE 12 (SUTURE) IMPLANT
SUT VIC AB 2-0 CT2 27 (SUTURE) IMPLANT
SUT VIC AB 3-0 SH 27 (SUTURE)
SUT VIC AB 3-0 SH 27X BRD (SUTURE) IMPLANT
SUT VICRYL AB 2 0 TIES (SUTURE) ×2 IMPLANT
SYR 20ML LL LF (SYRINGE) ×4 IMPLANT

## 2019-09-25 NOTE — Anesthesia Preprocedure Evaluation (Signed)
Anesthesia Evaluation  Patient identified by MRN, date of birth, ID band Patient awake    Reviewed: Allergy & Precautions, NPO status , Patient's Chart, lab work & pertinent test results  Airway Mallampati: II  TM Distance: >3 FB Neck ROM: Full    Dental  (+) Chipped, Dental Advisory Given Upper broken teethx2:   Pulmonary neg pulmonary ROS,    Pulmonary exam normal breath sounds clear to auscultation       Cardiovascular Exercise Tolerance: Good  Rhythm:Regular Rate:Normal     Neuro/Psych PSYCHIATRIC DISORDERS Anxiety negative neurological ROS     GI/Hepatic negative GI ROS, Neg liver ROS,   Endo/Other  diabetes  Renal/GU negative Renal ROS  negative genitourinary   Musculoskeletal  (+) Arthritis , Osteoarthritis,    Abdominal   Peds  Hematology negative hematology ROS (+)   Anesthesia Other Findings   Reproductive/Obstetrics                             Anesthesia Physical Anesthesia Plan  ASA: II  Anesthesia Plan: General   Post-op Pain Management:    Induction: Intravenous  PONV Risk Score and Plan: 3 and Dexamethasone, Ondansetron and Midazolam  Airway Management Planned: Oral ETT  Additional Equipment:   Intra-op Plan:   Post-operative Plan: Extubation in OR  Informed Consent: I have reviewed the patients History and Physical, chart, labs and discussed the procedure including the risks, benefits and alternatives for the proposed anesthesia with the patient or authorized representative who has indicated his/her understanding and acceptance.     Dental advisory given  Plan Discussed with: CRNA  Anesthesia Plan Comments:         Anesthesia Quick Evaluation

## 2019-09-25 NOTE — Anesthesia Procedure Notes (Signed)
Procedure Name: LMA Insertion Date/Time: 09/25/2019 10:30 AM Performed by: Moshe Salisbury, CRNA Pre-anesthesia Checklist: Patient identified, Patient being monitored, Emergency Drugs available, Timeout performed and Suction available Patient Re-evaluated:Patient Re-evaluated prior to induction Oxygen Delivery Method: Circle System Utilized Preoxygenation: Pre-oxygenation with 100% oxygen Induction Type: IV induction Ventilation: Mask ventilation without difficulty LMA: LMA inserted LMA Size: 4.0 Number of attempts: 1 Placement Confirmation: positive ETCO2 and breath sounds checked- equal and bilateral

## 2019-09-25 NOTE — Interval H&P Note (Signed)
History and Physical Interval Note:  09/25/2019 10:17 AM  Alan Barrera  has presented today for surgery, with the diagnosis of Ventral hernia.  The various methods of treatment have been discussed with the patient and family. After consideration of risks, benefits and other options for treatment, the patient has consented to  Procedure(s): HERNIA REPAIR VENTRAL ADULT (N/A) as a surgical intervention.  The patient's history has been reviewed, patient examined, no change in status, stable for surgery.  I have reviewed the patient's chart and labs.  Questions were answered to the patient's satisfaction.     Franky Macho

## 2019-09-25 NOTE — Op Note (Signed)
Patient:  Alan Barrera  DOB:  March 04, 1976  MRN:  [id number]   Preop Diagnosis: Ventral hernia  Postop Diagnosis: Same  Procedure: Ventral herniorrhaphy with mesh  Surgeon: Franky Macho, MD  Anes: General  Indications: Patient is a 44 year old white male who presents with a ventral hernia just superior to the umbilicus of a previously repaired umbilical hernia.  The risks and benefits of the procedure including bleeding, infection, mesh use, and the possibility of recurrence of the hernia were fully explained to the patient, who gave informed consent.  Procedure note: The patient was placed in supine position.  After general anesthesia was administered, the abdomen was prepped and draped using the usual sterile technique with ChloraPrep.  Surgical site confirmation was performed.  A midline incision was made in the supraumbilical region.  This was taken down to the fascia.  I was able to identify a 1.5 cm hernia defect which appeared to be superior to the previously placed patch mesh.  Omentum was noted to be emanating from this.  This was freed away from the fascia and ligation was performed of the omentum with a 3-0 Vicryl tie.  The omentum was then reduced into the abdominal cavity.  I did palpate the underside of the ventral wall and no other hernia defects were noted.  The previously placed mesh was in appropriate position.  A 4.3 cm Bard Ventralex ST patch was then inserted and secured to the fascia using 0 Ethibond interrupted sutures.  The overlying fascia was reapproximated using 0 Ethibond interrupted sutures in a transverse manner.  The subcutaneous layer was reapproximated using 3-0 Vicryl interrupted suture.  Exparel was instilled into the surrounding wound.  The skin was closed using a 4-0 Monocryl subcuticular suture.  Dermabond was applied.  All tape and needle counts were correct at the end of the procedure.  The patient was awakened and transferred to PACU in stable  condition.  Complications: None  EBL: Minimal  Specimen: None

## 2019-09-25 NOTE — Transfer of Care (Signed)
Immediate Anesthesia Transfer of Care Note  Patient: Alan Barrera  Procedure(s) Performed: HERNIA REPAIR VENTRAL ADULT (N/A Abdomen)  Patient Location: PACU  Anesthesia Type:General  Level of Consciousness: awake  Airway & Oxygen Therapy: Patient Spontanous Breathing  Post-op Assessment: Report given to RN  Post vital signs: Reviewed  Last Vitals:  Vitals Value Taken Time  BP    Temp    Pulse 72 09/25/19 1121  Resp 7 09/25/19 1121  SpO2 95 % 09/25/19 1121  Vitals shown include unvalidated device data.  Last Pain:  Vitals:   09/25/19 0941  PainSc: 0-No pain         Complications: No apparent anesthesia complications

## 2019-09-25 NOTE — Discharge Instructions (Signed)
Open Hernia Repair, Adult, Care After This sheet gives you information about how to care for yourself after your procedure. Your health care provider may also give you more specific instructions. If you have problems or questions, contact your health care provider. What can I expect after the procedure? After the procedure, it is common to have:  Mild discomfort.  Slight bruising.  Minor swelling.  Pain in the abdomen. Follow these instructions at home: Incision care   Follow instructions from your health care provider about how to take care of your incision area. Make sure you: ? Wash your hands with soap and water before you change your bandage (dressing). If soap and water are not available, use hand sanitizer. ? Change your dressing as told by your health care provider. ? Leave stitches (sutures), skin glue, or adhesive strips in place. These skin closures may need to stay in place for 2 weeks or longer. If adhesive strip edges start to loosen and curl up, you may trim the loose edges. Do not remove adhesive strips completely unless your health care provider tells you to do that.  Check your incision area every day for signs of infection. Check for: ? More redness, swelling, or pain. ? More fluid or blood. ? Warmth. ? Pus or a bad smell. Activity  Do not drive or use heavy machinery while taking prescription pain medicine. Do not drive until your health care provider approves.  Until your health care provider approves: ? Do not lift anything that is heavier than 10 lb (4.5 kg). ? Do not play contact sports.  Return to your normal activities as told by your health care provider. Ask your health care provider what activities are safe. General instructions  To prevent or treat constipation while you are taking prescription pain medicine, your health care provider may recommend that you: ? Drink enough fluid to keep your urine clear or pale yellow. ? Take over-the-counter or  prescription medicines. ? Eat foods that are high in fiber, such as fresh fruits and vegetables, whole grains, and beans. ? Limit foods that are high in fat and processed sugars, such as fried and sweet foods.  Take over-the-counter and prescription medicines only as told by your health care provider.  Do not take tub baths or go swimming until your health care provider approves.  Keep all follow-up visits as told by your health care provider. This is important. Contact a health care provider if:  You develop a rash.  You have more redness, swelling, or pain around your incision.  You have more fluid or blood coming from your incision.  Your incision feels warm to the touch.  You have pus or a bad smell coming from your incision.  You have a fever or chills.  You have blood in your stool (feces).  You have not had a bowel movement in 2-3 days.  Your pain is not controlled with medicine. Get help right away if:  You have chest pain or shortness of breath.  You feel light-headed or feel faint.  You have severe pain.  You vomit and your pain is worse. This information is not intended to replace advice given to you by your health care provider. Make sure you discuss any questions you have with your health care provider. Document Revised: 06/25/2017 Document Reviewed: 12/25/2015 Elsevier Patient Education  2020 Elsevier Inc.    PLEASE KEEP [place] EXPAREL BRACELET ON UNTIL Friday. NO FURTHER NUMBING MEDICATIONS UNTIL AFTER Friday 09/29/2019  General Anesthesia, Adult, Care After This sheet gives you information about how to care for yourself after your procedure. Your health care provider may also give you more specific instructions. If you have problems or questions, contact your health care provider. What can I expect after the procedure? After the procedure, the following side effects are common:  Pain or discomfort at the IV site.  Nausea.  Vomiting.  Sore  throat.  Trouble concentrating.  Feeling cold or chills.  Weak or tired.  Sleepiness and fatigue.  Soreness and body aches. These side effects can affect parts of the body that were not involved in surgery. Follow these instructions at home:  For at least 24 hours after the procedure:  Have a responsible adult stay with you. It is important to have someone help care for you until you are awake and alert.  Rest as needed.  Do not: ? Participate in activities in which you could fall or become injured. ? Drive. ? Use heavy machinery. ? Drink alcohol. ? Take sleeping pills or medicines that cause drowsiness. ? Make important decisions or sign legal documents. ? Take care of children on your own. Eating and drinking  Follow any instructions from your health care provider about eating or drinking restrictions.  When you feel hungry, start by eating small amounts of foods that are soft and easy to digest (bland), such as toast. Gradually return to your regular diet.  Drink enough fluid to keep your urine pale yellow.  If you vomit, rehydrate by drinking water, juice, or clear broth. General instructions  If you have sleep apnea, surgery and certain medicines can increase your risk for breathing problems. Follow instructions from your health care provider about wearing your sleep device: ? Anytime you are sleeping, including during daytime naps. ? While taking prescription pain medicines, sleeping medicines, or medicines that make you drowsy.  Return to your normal activities as told by your health care provider. Ask your health care provider what activities are safe for you.  Take over-the-counter and prescription medicines only as told by your health care provider.  If you smoke, do not smoke without supervision.  Keep all follow-up visits as told by your health care provider. This is important. Contact a health care provider if:  You have nausea or vomiting that does not  get better with medicine.  You cannot eat or drink without vomiting.  You have pain that does not get better with medicine.  You are unable to pass urine.  You develop a skin rash.  You have a fever.  You have redness around your IV site that gets worse. Get help right away if:  You have difficulty breathing.  You have chest pain.  You have blood in your urine or stool, or you vomit blood. Summary  After the procedure, it is common to have a sore throat or nausea. It is also common to feel tired.  Have a responsible adult stay with you for the first 24 hours after general anesthesia. It is important to have someone help care for you until you are awake and alert.  When you feel hungry, start by eating small amounts of foods that are soft and easy to digest (bland), such as toast. Gradually return to your regular diet.  Drink enough fluid to keep your urine pale yellow.  Return to your normal activities as told by your health care provider. Ask your health care provider what activities are safe for you. This information is  not intended to replace advice given to you by your health care provider. Make sure you discuss any questions you have with your health care provider. Document Revised: 07/16/2017 Document Reviewed: 02/26/2017 Elsevier Patient Education  [place].

## 2019-09-26 NOTE — Anesthesia Postprocedure Evaluation (Signed)
Anesthesia Post Note  Patient: Alan Barrera  Procedure(s) Performed: HERNIA REPAIR VENTRAL ADULT WITH MESH (N/A Abdomen)  Patient location during evaluation: PACU Anesthesia Type: General Level of consciousness: awake and alert and oriented Pain management: pain level controlled Vital Signs Assessment: post-procedure vital signs reviewed and stable Respiratory status: spontaneous breathing and respiratory function stable Cardiovascular status: blood pressure returned to baseline and stable Postop Assessment: no apparent nausea or vomiting Anesthetic complications: no     Last Vitals:  Vitals:   09/25/19 1145 09/25/19 1154  BP: 127/90 (!) 125/96  Pulse: 67 73  Resp: 12 16  Temp:  36.7 C  SpO2: 94% 94%    Last Pain:  Vitals:   09/25/19 1154  TempSrc: Oral  PainSc: 7                  Alan Barrera

## 2019-10-03 ENCOUNTER — Other Ambulatory Visit: Payer: Self-pay

## 2019-10-03 ENCOUNTER — Telehealth (INDEPENDENT_AMBULATORY_CARE_PROVIDER_SITE_OTHER): Payer: Self-pay | Admitting: General Surgery

## 2019-10-03 DIAGNOSIS — Z09 Encounter for follow-up examination after completed treatment for conditions other than malignant neoplasm: Secondary | ICD-10-CM

## 2019-10-03 NOTE — Progress Notes (Signed)
Subjective:     Alan Barrera  Virtual phone follow-up visit performed.  Patient is doing well.  He has no complaints.  His incision is healing well. Objective:    There were no vitals taken for this visit.  General:  alert and cooperative       Assessment:    Doing well postoperatively.    Plan:   Patient may return to work without restrictions today.  Patient was instructed to call me should he develop any problems.  He is pleased with the results.

## 2019-10-22 ENCOUNTER — Other Ambulatory Visit: Payer: Self-pay | Admitting: Family Medicine

## 2019-10-23 ENCOUNTER — Other Ambulatory Visit: Payer: Self-pay

## 2019-10-23 MED ORDER — CITALOPRAM HYDROBROMIDE 40 MG PO TABS: 40.0000 mg | ORAL_TABLET | Freq: Every day | ORAL | 3 refills | Status: DC

## 2020-01-08 ENCOUNTER — Telehealth: Payer: Self-pay | Admitting: Family Medicine

## 2020-01-08 DIAGNOSIS — Z13220 Encounter for screening for lipoid disorders: Secondary | ICD-10-CM

## 2020-01-08 DIAGNOSIS — Z0000 Encounter for general adult medical examination without abnormal findings: Secondary | ICD-10-CM

## 2020-01-08 DIAGNOSIS — Z79899 Other long term (current) drug therapy: Secondary | ICD-10-CM

## 2020-01-08 NOTE — Telephone Encounter (Signed)
Pls order cbc, cmp, lipids.  Thx, dr. Shelitha Magley  

## 2020-01-08 NOTE — Telephone Encounter (Signed)
Lab orders placed. Left message to return call  

## 2020-01-08 NOTE — Telephone Encounter (Signed)
Pt has CPE on 6/18 and would like lab work done before appt.

## 2020-01-08 NOTE — Telephone Encounter (Signed)
Last labs completed on 08/08/2018 Testosterone, BMET, lipid and hepatic. Please advise. Thank you

## 2020-01-10 NOTE — Telephone Encounter (Signed)
Pt returned call and verbalized understanding. Pt states he will have to have labs done on Friday after his appointment.

## 2020-01-10 NOTE — Telephone Encounter (Signed)
Left message to return call 

## 2020-01-12 ENCOUNTER — Ambulatory Visit (INDEPENDENT_AMBULATORY_CARE_PROVIDER_SITE_OTHER): Payer: Managed Care, Other (non HMO) | Admitting: Family Medicine

## 2020-01-12 ENCOUNTER — Other Ambulatory Visit: Payer: Self-pay

## 2020-01-12 VITALS — BP 136/104 | HR 83 | Temp 97.4°F | Wt 174.8 lb

## 2020-01-12 DIAGNOSIS — Z0000 Encounter for general adult medical examination without abnormal findings: Secondary | ICD-10-CM | POA: Diagnosis not present

## 2020-01-12 DIAGNOSIS — F411 Generalized anxiety disorder: Secondary | ICD-10-CM | POA: Diagnosis not present

## 2020-01-12 DIAGNOSIS — Z9889 Other specified postprocedural states: Secondary | ICD-10-CM

## 2020-01-12 DIAGNOSIS — N5201 Erectile dysfunction due to arterial insufficiency: Secondary | ICD-10-CM | POA: Diagnosis not present

## 2020-01-12 DIAGNOSIS — Z8719 Personal history of other diseases of the digestive system: Secondary | ICD-10-CM

## 2020-01-12 DIAGNOSIS — R1033 Periumbilical pain: Secondary | ICD-10-CM | POA: Diagnosis not present

## 2020-01-12 MED ORDER — AMLODIPINE BESYLATE 5 MG PO TABS: 5.0000 mg | ORAL_TABLET | Freq: Every day | ORAL | 0 refills | Status: DC

## 2020-01-12 MED ORDER — CITALOPRAM HYDROBROMIDE 40 MG PO TABS: 40.0000 mg | ORAL_TABLET | Freq: Every day | ORAL | 5 refills | Status: DC

## 2020-01-12 MED ORDER — SILDENAFIL CITRATE 20 MG PO TABS: ORAL_TABLET | ORAL | 5 refills | Status: DC

## 2020-01-12 NOTE — Progress Notes (Signed)
Patient ID: Alan Barrera, male    DOB: [birth year], 44 y.o.   MRN: [id number]   Chief Complaint  Patient presents with  . Annual Exam   Subjective:    HPI  The patient comes in today for a wellness visit.  Pt seen with his girlfriend.  A review of their health history was completed.  A review of medications was also completed.  Any needed refills; none  Eating habits: all over the place, not nad not great  Falls/  MVA accidents in past few months: none  Regular exercise: everyday   Specialist pt sees on regular basis: none  Preventative health issues were discussed.   Additional concerns: place on ear and nose, referral to dermatology   Pt wanting psa ordered.  (friend with recent dx of metastatic prostate cancer, and feeling worried.) Increased frequency with urination at times, but states he drinks a lot of water. No issues with dec urinary stream or nocturia.  Slightly elevated LDL and TG in past.  When pt was dipping tobacco and stopped and bowel movements have changed.  Feels more constipated and takes 3-4 days to have BM at times.  Not taking anything for constipation. Has mesh in abd from 2 hernia repairs from the umbilical area.  At times feels a "pulling feeling if he moves certain way." No severe abd pain, fever, vomiting, diarrhea, hematochezia, or melena.  Medical History Reynoldo has a past medical history of Anxiety and Arthritis.   Outpatient Encounter Medications as of 01/12/2020  Medication Sig  . amLODipine (NORVASC) 5 MG tablet Take 1 tablet (5 mg total) by mouth daily.  . citalopram (CELEXA) 40 MG tablet Take 1 tablet (40 mg total) by mouth daily.  . sildenafil (REVATIO) 20 MG tablet Take 3 tablets po 1 -2 hours prior to sex  . [DISCONTINUED] citalopram (CELEXA) 40 MG tablet Take 1 tablet (40 mg total) by mouth daily.  . [DISCONTINUED] oxyCODONE-acetaminophen (PERCOCET) 7.5-325 MG tablet Take 1 tablet by mouth every 6 (six) hours as needed.    . [DISCONTINUED] sildenafil (REVATIO) 20 MG tablet Take 3 tablets po 1 -2 hours prior to sex   No facility-administered encounter medications on file as of 01/12/2020.     Review of Systems  Constitutional: Negative for chills and fever.  HENT: Negative for congestion, rhinorrhea and sore throat.   Respiratory: Negative for cough, shortness of breath and wheezing.   Cardiovascular: Negative for chest pain and leg swelling.  Gastrointestinal: Positive for constipation. Negative for abdominal distention, abdominal pain, diarrhea, nausea and vomiting.  Genitourinary: Positive for frequency. Negative for decreased urine volume, difficulty urinating, dysuria and hematuria.  Skin: Negative for rash.  Neurological: Negative for dizziness, weakness and headaches.  Psychiatric/Behavioral: The patient is nervous/anxious.      Vitals BP (!) 136/104   Pulse 83   Temp (!) 97.4 F (36.3 C)   Wt 174 lb 12.8 oz (79.3 kg)   SpO2 99%   BMI 24.38 kg/m   Objective:   Physical Exam Vitals and nursing note reviewed.  Constitutional:      General: He is not in acute distress.    Appearance: Normal appearance. He is not ill-appearing.  HENT:     Head: Normocephalic.     Nose: Nose normal. No congestion.     Mouth/Throat:     Mouth: Mucous membranes are moist.     Pharynx: No oropharyngeal exudate.  Eyes:     Extraocular Movements: Extraocular movements intact.  Conjunctiva/sclera: Conjunctivae normal.     Pupils: Pupils are equal, round, and reactive to light.  Cardiovascular:     Rate and Rhythm: Normal rate and regular rhythm.     Pulses: Normal pulses.     Heart sounds: Normal heart sounds. No murmur heard.   Pulmonary:     Effort: Pulmonary effort is normal.     Breath sounds: Normal breath sounds. No wheezing, rhonchi or rales.  Abdominal:     General: Abdomen is flat. Bowel sounds are normal. There is no distension.     Palpations: Abdomen is soft. There is no mass.      Tenderness: There is no abdominal tenderness. There is no guarding or rebound.     Hernia: No hernia is present.  Musculoskeletal:        General: Normal range of motion.     Right lower leg: No edema.     Left lower leg: No edema.  Skin:    General: Skin is warm and dry.     Findings: No rash.  Neurological:     General: No focal deficit present.     Mental Status: He is alert and oriented to person, place, and time.     Cranial Nerves: No cranial nerve deficit.     Motor: No weakness.  Psychiatric:        Behavior: Behavior normal.        Thought Content: Thought content normal.        Judgment: Judgment normal.     Comments: +anxious      Assessment and Plan   1. Well adult exam  2. Laboratory tests ordered as part of a complete physical exam (CPE) - CBC - CMP14+EGFR - Hemoglobin A1c - Lipid panel - PSA  3. Erectile dysfunction due to arterial insufficiency  4. Generalized anxiety disorder  5. Periumbilical pain  6. History of umbilical hernia repair   htn- uncontrolled, will start norvasc.  Periumbilical abd pain- h/o hernia repair x2.  Advising pt likely related to scar tissue from 2 hernia surgeries near umbilicus.  Cont to monitor.  Return if fever, severe pain, redness, blood in stool, vomiting.  Anxiety- cont celexa.  ED- cont sildenafil.   F/u 74mofor BP recheck or prn.

## 2020-01-12 NOTE — Patient Instructions (Signed)

## 2020-01-13 LAB — CBC
Hematocrit: 48 % (ref 37.5–51.0)
Hemoglobin: 17 g/dL (ref 13.0–17.7)
MCH: 31.8 pg (ref 26.6–33.0)
MCHC: 35.4 g/dL (ref 31.5–35.7)
MCV: 90 fL (ref 79–97)
Platelets: 199 10*3/uL (ref 150–450)
RBC: 5.35 x10E6/uL (ref 4.14–5.80)
RDW: 12 % (ref 11.6–15.4)
WBC: 4.9 10*3/uL (ref 3.4–10.8)

## 2020-01-13 LAB — CMP14+EGFR
ALT: 33 IU/L (ref 0–44)
AST: 29 IU/L (ref 0–40)
Albumin/Globulin Ratio: 1.6 (ref 1.2–2.2)
Albumin: 4.3 g/dL (ref 4.0–5.0)
Alkaline Phosphatase: 74 IU/L (ref 48–121)
BUN/Creatinine Ratio: 16 (ref 9–20)
BUN: 14 mg/dL (ref 6–24)
Bilirubin Total: 0.5 mg/dL (ref 0.0–1.2)
CO2: 25 mmol/L (ref 20–29)
Calcium: 9.3 mg/dL (ref 8.7–10.2)
Chloride: 101 mmol/L (ref 96–106)
Creatinine, Ser: 0.87 mg/dL (ref 0.76–1.27)
GFR calc Af Amer: 120 mL/min/{1.73_m2} (ref >59)
GFR calc non Af Amer: 104 mL/min/{1.73_m2} (ref >59)
Globulin, Total: 2.7 g/dL (ref 1.5–4.5)
Glucose: 96 mg/dL (ref 65–99)
Potassium: 4.7 mmol/L (ref 3.5–5.2)
Sodium: 139 mmol/L (ref 134–144)
Total Protein: 7 g/dL (ref 6.0–8.5)

## 2020-01-13 LAB — PSA: Prostate Specific Ag, Serum: 0.5 ng/mL (ref 0.0–4.0)

## 2020-01-13 LAB — LIPID PANEL
Chol/HDL Ratio: 3.6 ratio (ref 0.0–5.0)
Cholesterol, Total: 199 mg/dL (ref 100–199)
HDL: 55 mg/dL (ref >39)
LDL Chol Calc (NIH): 114 mg/dL — ABNORMAL HIGH (ref 0–99)
Triglycerides: 169 mg/dL — ABNORMAL HIGH (ref 0–149)
VLDL Cholesterol Cal: 30 mg/dL (ref 5–40)

## 2020-01-13 LAB — HEMOGLOBIN A1C
Est. average glucose Bld gHb Est-mCnc: 100 mg/dL
Hgb A1c MFr Bld: 5.1 % (ref 4.8–5.6)

## 2020-06-14 DIAGNOSIS — K439 Ventral hernia without obstruction or gangrene: Secondary | ICD-10-CM

## 2020-08-19 ENCOUNTER — Encounter: Payer: Self-pay | Admitting: Family Medicine

## 2020-08-19 ENCOUNTER — Other Ambulatory Visit: Payer: Self-pay

## 2020-08-19 ENCOUNTER — Telehealth (INDEPENDENT_AMBULATORY_CARE_PROVIDER_SITE_OTHER): Payer: Managed Care, Other (non HMO) | Admitting: Family Medicine

## 2020-08-19 DIAGNOSIS — J40 Bronchitis, not specified as acute or chronic: Secondary | ICD-10-CM

## 2020-08-19 DIAGNOSIS — R059 Cough, unspecified: Secondary | ICD-10-CM

## 2020-08-19 MED ORDER — ALBUTEROL SULFATE HFA 108 (90 BASE) MCG/ACT IN AERS: 2.0000 | INHALATION_SPRAY | Freq: Four times a day (QID) | RESPIRATORY_TRACT | 0 refills | Status: DC | PRN

## 2020-08-19 MED ORDER — PREDNISONE 20 MG PO TABS: 20.0000 mg | ORAL_TABLET | Freq: Every day | ORAL | 0 refills | Status: DC

## 2020-08-19 MED ORDER — BENZONATATE 100 MG PO CAPS: 100.0000 mg | ORAL_CAPSULE | Freq: Two times a day (BID) | ORAL | 0 refills | Status: DC | PRN

## 2020-08-19 NOTE — Progress Notes (Unsigned)
Patient ID: Alan Barrera, male    DOB: August 31, 1975, 45 y.o.   MRN: [id number]   Chief Complaint  Patient presents with  . Cough   Subjective:  CC: cough and dry cough  This is a new problem.  Presents today via telephone visit with a complaint of cough, congestion, and sinus drainage.  Symptoms have been present for 7 days.  Has tried over-the-counter medications for congestion.  Had a negative home Covid test 4 days ago.  Pertinent negatives include no fever, no chills, no chest pain, no shortness of breath.  He is currently working out of town in [place].    Patient presents today with respiratory illness Number of days present- 7 days  Symptoms include- dry cough, congestion, sinus drainage at night. Tried otc med for congestion.   Presence of worrisome signs (severe shortness of breath, lethargy, etc.) - none  Recent/current visit to urgent care or ER- none  Recent direct exposure to Covid- none  Any current Covid testing-yes tested 4 days ago with in home test and it was negative   Medical History Milam has a past medical history of Anxiety and Arthritis.   Outpatient Encounter Medications as of 08/19/2020  Medication Sig  . albuterol (VENTOLIN HFA) 108 (90 Base) MCG/ACT inhaler Inhale 2 puffs into the lungs every 6 (six) hours as needed for wheezing or shortness of breath.  Marland Kitchen amLODipine (NORVASC) 5 MG tablet Take 1 tablet (5 mg total) by mouth daily.  . benzonatate (TESSALON) 100 MG capsule Take 1 capsule (100 mg total) by mouth 2 (two) times daily as needed for cough.  . citalopram (CELEXA) 40 MG tablet Take 1 tablet (40 mg total) by mouth daily.  . predniSONE (DELTASONE) 20 MG tablet Take 1 tablet (20 mg total) by mouth daily with breakfast. For 7 days  . sildenafil (REVATIO) 20 MG tablet Take 3 tablets po 1 -2 hours prior to sex   No facility-administered encounter medications on file as of 08/19/2020.   Virtual Visit via Video Note  I  connected with SULIMAN TERMINI on 08/19/20 at  1:30 PM EST by a video enabled telemedicine application and verified that I am speaking with the correct person using two identifiers.  Location: Patient: home Provider: office   I discussed the limitations of evaluation and management by telemedicine and the availability of in person appointments. The patient expressed understanding and agreed to proceed.  History of Present Illness:    Observations/Objective:   Assessment and Plan:   Follow Up Instructions:    I discussed the assessment and treatment plan with the patient. The patient was provided an opportunity to ask questions and all were answered. The patient agreed with the plan and demonstrated an understanding of the instructions.   The patient was advised to call back or seek an in-person evaluation if the symptoms worsen or if the condition fails to improve as anticipated.  I provided 10 minutes of non-face-to-face time during this encounter.      Review of Systems  Constitutional: Negative for chills, fatigue and fever.  HENT: Positive for congestion and postnasal drip. Negative for ear pain, sinus pressure, sinus pain and sore throat.        Sore throat resoved  Respiratory: Positive for cough and chest tightness. Negative for shortness of breath.        Tightness worse with coughing  Cardiovascular: Negative for chest pain.  Gastrointestinal: Negative for abdominal pain, diarrhea, nausea and vomiting.  Musculoskeletal: Negative for myalgias.  Neurological: Negative for headaches.     Vitals There were no vitals taken for this visit. Unable, does not have an oxygen saturation meter.   Objective:   Physical Exam No physical exam, able to converse throughout telephone interview without obvious shortness of breath.  Assessment and Plan   1. Cough - albuterol (VENTOLIN HFA) 108 (90 Base) MCG/ACT inhaler; Inhale 2 puffs into the lungs every 6 (six) hours as  needed for wheezing or shortness of breath.  Dispense: 8 g; Refill: 0 - benzonatate (TESSALON) 100 MG capsule; Take 1 capsule (100 mg total) by mouth 2 (two) times daily as needed for cough.  Dispense: 20 capsule; Refill: 0  2. Bronchitis - predniSONE (DELTASONE) 20 MG tablet; Take 1 tablet (20 mg total) by mouth daily with breakfast. For 7 days  Dispense: 7 tablet; Refill: 0   Due to cough and chest tightness, will treat bronchitis with prednisone for 7 days.  Prescribed albuterol inhaler and Tessalon Perls as well.  Recommend repeat Covid testing. Recommend supportive therapy, adequate hydration, saline nasal flushes, and humidification.  Agrees with plan of care discussed today. Understands warning signs to seek further care: chest pain, shortness of breath, any significant change in health.  Understands to follow-up if symptoms do not improve or worsen.   Covid-19 warning:  Covid-19 is a virus that causes hypoxia (low oxygen level in blood) in some people. If you develop any changes in your usual breathing pattern: difficulty catching your breath, more short winded with activity or with resting, or anything that concerns you about your breathing, do not hesitate to go to the emergency department immediately for evaluation. Covid infection can also affect the way the brain functions if it lacks oxygen, such as, feeling dizzy, passing out, or feeling confused, if you experience any of these symptoms, please do not delay to seek treatment.  Some people experience gastrointestinal problems with Covid, such as vomiting and diarrhea, dehydration is a serious risk and should be avoided. If you are unable to keep liquids down you may need to go to the emergency department for intravenous fluids to avoid dehydration.   Please alert and involve your family and/or friends to help keep an eye on you while you recover from Covid-19. If you have any questions or concerns about your recovery, please do not  hesitate to call the office for guidance.   Covid-19 Quarantine Instructions:   You have tested positive for Covid-19 infection. The current CDC guidelines for quarantine regardless of vaccination status are:   Please quarantine and isolate at home for a minimum of  5 days.   - If you have no symptoms or your symptoms are resolving after 5 days you   can leave the home (resolving means no shortness of breath, no fever, without taking fever reducing medication, no headache, etc). -Continue to wear a mask around others for an additional 5 days.  -If you were severely ill with Covid-19 you should isolate for at least 10 days.    Use over-the-counter medications for symptoms.If you develop respiratory issues/distress (see Covid warning), seek medical care in the Emergency Department.  If you must leave home or if you have to be around others please wear a mask. Please limit contact with immediate family members in the home, practice social distancing, frequent handwashing and clean hard surfaces touched frequently with household cleaning products. Members of your household will also need to quarantine for 5 days and test on  day five if possible.  Covid-19 warning:  Covid-19 is a virus that causes hypoxia (low oxygen level in blood) in some people. If you develop any changes in your usual breathing pattern: difficulty catching your breath, more short winded with activity or with resting, or anything that concerns you about your breathing, do not hesitate to go to the emergency department immediately for evaluation. Covid infection can also affect the way the brain functions if it lacks oxygen, such as, feeling dizzy, passing out, or feeling confused, if you experience any of these symptoms, please do not delay to seek treatment.  Some people experience gastrointestinal problems with Covid, such as vomiting and diarrhea, dehydration is a serious risk and should be avoided. If you are unable to keep  liquids down you may need to go to the emergency department for intravenous fluids to avoid dehydration.   Please alert and involve your family and/or friends to help keep an eye on you while you recover from Covid-19. If you have any questions or concerns about your recovery, please do not hesitate to call the office for guidance.    Recommend supportive therapy while you are recovering:   1) Get lots of rest.  2) Take over the counter pain medication if needed, such as acetaminophen or ibuprofen. Read and follow instructions on the label and make sure not to combine other medications that may have same ingredients in it. It is important to not take too much of these ingredients.  3) Drink plenty of caffeine-free fluids. (If you have heart or kidney problems, follow the instructions of your specialist regarding amounts).  4) If you are hungry, eat a bland diet, such as the BRAT diet (bananas, rice, applesauce, toast).  5) Let us know if you are not feeling better in a week.   08/19/2020

## 2020-08-19 NOTE — Patient Instructions (Signed)
Covid-19 warning:  Covid-19 is a virus that causes hypoxia (low oxygen level in blood) in some people. If you develop any changes in your usual breathing pattern: difficulty catching your breath, more short winded with activity or with resting, or anything that concerns you about your breathing, do not hesitate to go to the emergency department immediately for evaluation. Covid infection can also affect the way the brain functions if it lacks oxygen, such as, feeling dizzy, passing out, or feeling confused, if you experience any of these symptoms, please do not delay to seek treatment.  Some people experience gastrointestinal problems with Covid, such as vomiting and diarrhea, dehydration is a serious risk and should be avoided. If you are unable to keep liquids down you may need to go to the emergency department for intravenous fluids to avoid dehydration.   Please alert and involve your family and/or friends to help keep an eye on you while you recover from Covid-19. If you have any questions or concerns about your recovery, please do not hesitate to call the office for guidance.   Covid-19 Quarantine Instructions:   You have tested positive for Covid-19 infection. The current CDC guidelines for quarantine regardless of vaccination status are:   Please quarantine and isolate at home for a minimum of  5 days.   - If you have no symptoms or your symptoms are resolving after 5 days you   can leave the home (resolving means no shortness of breath, no fever, without taking fever reducing medication, no headache, etc). -Continue to wear a mask around others for an additional 5 days.  -If you were severely ill with Covid-19 you should isolate for at least 10 days.    Use over-the-counter medications for symptoms.If you develop respiratory issues/distress (see Covid warning), seek medical care in the Emergency Department.  If you must leave home or if you have to be around others please wear a mask.  Please limit contact with immediate family members in the home, practice social distancing, frequent handwashing and clean hard surfaces touched frequently with household cleaning products. Members of your household will also need to quarantine for 5 days and test on day five if possible.  Covid-19 warning:  Covid-19 is a virus that causes hypoxia (low oxygen level in blood) in some people. If you develop any changes in your usual breathing pattern: difficulty catching your breath, more short winded with activity or with resting, or anything that concerns you about your breathing, do not hesitate to go to the emergency department immediately for evaluation. Covid infection can also affect the way the brain functions if it lacks oxygen, such as, feeling dizzy, passing out, or feeling confused, if you experience any of these symptoms, please do not delay to seek treatment.  Some people experience gastrointestinal problems with Covid, such as vomiting and diarrhea, dehydration is a serious risk and should be avoided. If you are unable to keep liquids down you may need to go to the emergency department for intravenous fluids to avoid dehydration.   Please alert and involve your family and/or friends to help keep an eye on you while you recover from Covid-19. If you have any questions or concerns about your recovery, please do not hesitate to call the office for guidance.    Recommend supportive therapy while you are recovering:   1) Get lots of rest.  2) Take over the counter pain medication if needed, such as acetaminophen or ibuprofen. Read and follow instructions on the label  and make sure not to combine other medications that may have same ingredients in it. It is important to not take too much of these ingredients.  3) Drink plenty of caffeine-free fluids. (If you have heart or kidney problems, follow the instructions of your specialist regarding amounts).  4) If you are hungry, eat a bland diet,  such as the BRAT diet (bananas, rice, applesauce, toast).  5) Let us know if you are not feeling better in a week.  

## 2021-02-23 ENCOUNTER — Other Ambulatory Visit: Payer: Self-pay | Admitting: Family Medicine

## 2021-02-24 NOTE — Telephone Encounter (Signed)
Sent my chart nessage

## 2021-03-19 NOTE — Telephone Encounter (Signed)
Appointment 8-26.

## 2021-03-21 ENCOUNTER — Ambulatory Visit (INDEPENDENT_AMBULATORY_CARE_PROVIDER_SITE_OTHER): Payer: Managed Care, Other (non HMO) | Admitting: Family Medicine

## 2021-03-21 ENCOUNTER — Other Ambulatory Visit: Payer: Self-pay

## 2021-03-21 VITALS — BP 126/86 | HR 76 | Ht 71.0 in | Wt 185.6 lb

## 2021-03-21 DIAGNOSIS — F411 Generalized anxiety disorder: Secondary | ICD-10-CM

## 2021-03-21 DIAGNOSIS — N5201 Erectile dysfunction due to arterial insufficiency: Secondary | ICD-10-CM

## 2021-03-21 MED ORDER — SILDENAFIL CITRATE 20 MG PO TABS: ORAL_TABLET | ORAL | 1 refills | Status: DC

## 2021-03-21 MED ORDER — CITALOPRAM HYDROBROMIDE 40 MG PO TABS: 40.0000 mg | ORAL_TABLET | Freq: Every day | ORAL | 1 refills | Status: DC

## 2021-03-21 NOTE — Progress Notes (Signed)
Patient ID: Alan Barrera, male    DOB: January 31, 1976, 45 y.o.   MRN: [id number]   Chief Complaint  Patient presents with   Hypertension    Follow up   Anxiety   Subjective:    HPI  HTN Pt compliant with BP meds.  No SEs Denies chest pain, sob, LE swelling, or blurry vision.  Pt taking celexa 40mg  daily.  Doing well, has been taking this for anxiety.  Pt stating he works in .  But still coming to our office for pcp. Advised he could go to urgent care or get a pcp in [place] if he's here a large part of the year.  Pt has h/o ED- taking sildenafil. No side effects.  Medical History Alan Barrera has a past medical history of Anxiety and Arthritis.   Outpatient Encounter Medications as of 03/21/2021  Medication Sig   albuterol (VENTOLIN HFA) 108 (90 Base) MCG/ACT inhaler Inhale 2 puffs into the lungs every 6 (six) hours as needed for wheezing or shortness of breath.   citalopram (CELEXA) 40 MG tablet Take 1 tablet (40 mg total) by mouth daily.   sildenafil (REVATIO) 20 MG tablet Take 3 tablets po 1 -2 hours prior to sex   [DISCONTINUED] amLODipine (NORVASC) 5 MG tablet Take 1 tablet (5 mg total) by mouth daily.   [DISCONTINUED] benzonatate (TESSALON) 100 MG capsule Take 1 capsule (100 mg total) by mouth 2 (two) times daily as needed for cough.   [DISCONTINUED] citalopram (CELEXA) 40 MG tablet Take 1 tablet (40 mg total) by mouth daily.   [DISCONTINUED] predniSONE (DELTASONE) 20 MG tablet Take 1 tablet (20 mg total) by mouth daily with breakfast. For 7 days   [DISCONTINUED] sildenafil (REVATIO) 20 MG tablet Take 3 tablets po 1 -2 hours prior to sex   No facility-administered encounter medications on file as of 03/21/2021.     Review of Systems  Constitutional:  Negative for chills and fever.  HENT:  Negative for congestion, rhinorrhea and sore throat.   Respiratory:  Negative for cough, shortness of breath and wheezing.   Cardiovascular:  Negative for chest pain and leg  swelling.  Gastrointestinal:  Negative for abdominal pain, diarrhea, nausea and vomiting.  Genitourinary:  Negative for dysuria and frequency.  Skin:  Negative for rash.  Neurological:  Negative for dizziness, weakness and headaches.    Vitals BP 126/86   Pulse 76   Ht 5\' 11"  (1.803 m)   Wt 185 lb 9.6 oz (84.2 kg)   BMI 25.89 kg/m   Objective:   Physical Exam Vitals and nursing note reviewed.  Constitutional:      General: He is not in acute distress.    Appearance: Normal appearance. He is not ill-appearing.  HENT:     Head: Normocephalic.     Nose: Nose normal. No congestion.     Mouth/Throat:     Mouth: Mucous membranes are moist.     Pharynx: No oropharyngeal exudate.  Eyes:     Extraocular Movements: Extraocular movements intact.     Conjunctiva/sclera: Conjunctivae normal.     Pupils: Pupils are equal, round, and reactive to light.  Cardiovascular:     Rate and Rhythm: Normal rate and regular rhythm.     Pulses: Normal pulses.     Heart sounds: Normal heart sounds. No murmur heard. Pulmonary:     Effort: Pulmonary effort is normal.     Breath sounds: Normal breath sounds. No wheezing, rhonchi or rales.  Musculoskeletal:  General: Normal range of motion.     Right lower leg: No edema.     Left lower leg: No edema.  Skin:    General: Skin is warm and dry.     Findings: No rash.  Neurological:     General: No focal deficit present.     Mental Status: He is alert and oriented to person, place, and time.     Cranial Nerves: No cranial nerve deficit.  Psychiatric:        Mood and Affect: Mood normal.        Behavior: Behavior normal.        Thought Content: Thought content normal.        Judgment: Judgment normal.     Assessment and Plan   1. Generalized anxiety disorder  2. Erectile dysfunction due to arterial insufficiency   H/o htn in past, was given norvasc, pt stating not taking it any longer.  ED- taking sildenafil, no side  effects.  GAD- stable. Taking meds daily. Cont with celexa.   Return in about 6 months (around 09/21/2021) for f/u depression/ ED.

## 2021-12-09 ENCOUNTER — Other Ambulatory Visit: Payer: Self-pay | Admitting: Family Medicine

## 2021-12-15 ENCOUNTER — Other Ambulatory Visit: Payer: Self-pay

## 2021-12-15 MED ORDER — CITALOPRAM HYDROBROMIDE 40 MG PO TABS: 40.0000 mg | ORAL_TABLET | Freq: Every day | ORAL | 0 refills | Status: DC

## 2021-12-15 MED ORDER — SILDENAFIL CITRATE 20 MG PO TABS: ORAL_TABLET | ORAL | 0 refills | Status: DC

## 2021-12-25 NOTE — Telephone Encounter (Signed)
Sent my chart message 12/25/21 to schedule appointment 

## 2022-01-06 ENCOUNTER — Ambulatory Visit: Payer: Self-pay | Admitting: Nurse Practitioner

## 2022-01-15 ENCOUNTER — Encounter: Payer: Self-pay | Admitting: Nurse Practitioner

## 2022-01-15 ENCOUNTER — Ambulatory Visit (INDEPENDENT_AMBULATORY_CARE_PROVIDER_SITE_OTHER): Payer: Managed Care, Other (non HMO) | Admitting: Nurse Practitioner

## 2022-01-15 VITALS — BP 134/88 | HR 80 | Temp 98.4°F | Wt 189.2 lb

## 2022-01-15 DIAGNOSIS — F411 Generalized anxiety disorder: Secondary | ICD-10-CM

## 2022-01-15 DIAGNOSIS — N5201 Erectile dysfunction due to arterial insufficiency: Secondary | ICD-10-CM

## 2022-01-15 MED ORDER — SILDENAFIL CITRATE 20 MG PO TABS: ORAL_TABLET | ORAL | 1 refills | Status: DC

## 2022-01-15 MED ORDER — CITALOPRAM HYDROBROMIDE 40 MG PO TABS: 40.0000 mg | ORAL_TABLET | Freq: Every day | ORAL | 1 refills | Status: DC

## 2022-01-15 NOTE — Progress Notes (Signed)
   Subjective:    Patient ID: Alan Barrera, male    DOB: 01/15/1976, 46 y.o.   MRN: [id number]  HPI Pt here today for follow up on medication. Pt currently on Celexa 40 mg daily. No issues with med. Pt states no issues or concerns at this time.    Review of Systems  All other systems reviewed and are negative.      Objective:   Physical Exam Vitals reviewed.  Constitutional:      General: He is not in acute distress.    Appearance: Normal appearance. He is normal weight. He is not ill-appearing, toxic-appearing or diaphoretic.  HENT:     Head: Normocephalic and atraumatic.  Cardiovascular:     Rate and Rhythm: Normal rate and regular rhythm.     Pulses: Normal pulses.     Heart sounds: Normal heart sounds. No murmur heard. Pulmonary:     Effort: Pulmonary effort is normal. No respiratory distress.     Breath sounds: Normal breath sounds. No wheezing.  Musculoskeletal:     Cervical back: Normal range of motion and neck supple. No rigidity or tenderness.     Comments: Grossly intact  Lymphadenopathy:     Cervical: No cervical adenopathy.  Skin:    General: Skin is warm.     Capillary Refill: Capillary refill takes less than 2 seconds.  Neurological:     Mental Status: He is alert.     Comments: Grossly intact  Psychiatric:        Mood and Affect: Mood normal.        Behavior: Behavior normal.           Assessment & Plan:  1. Generalized anxiety disorder -Patient states that he is doing well on Celexa 40 mg.  We will continue with prescription. - citalopram (CELEXA) 40 MG tablet; Take 1 tablet (40 mg total) by mouth daily.  Dispense: 90 tablet; Refill: 1 -Return to clinic in a approximately 3 weeks for physical exam  2. Erectile dysfunction due to arterial insufficiency - sildenafil (REVATIO) 20 MG tablet; Take 3 tablets po 1 -2 hours prior to sex  Dispense: 90 tablet; Refill: 1 -Patient has no issues with taking Revatio.  To continue with prescription as  needed  Patient to return to clinic in July for annual exam.  Patient would like all his lab work done.  Patient to discuss whether he would like a PSA test and or manual prostate exam exam.    Note:  This document was prepared using Dragon voice recognition software and may include unintentional dictation errors. Note - This record has been created using [employer].  Chart creation errors have been sought, but may not always  have been located. Such creation errors do not reflect on  the standard of medical care.

## 2022-01-16 NOTE — Telephone Encounter (Signed)
Patient had appointment on 01/15/22

## 2022-02-05 ENCOUNTER — Encounter: Payer: Managed Care, Other (non HMO) | Admitting: Nurse Practitioner

## 2022-02-05 ENCOUNTER — Telehealth: Payer: Self-pay

## 2022-02-05 ENCOUNTER — Other Ambulatory Visit: Payer: Self-pay | Admitting: Nurse Practitioner

## 2022-02-05 DIAGNOSIS — Z13220 Encounter for screening for lipoid disorders: Secondary | ICD-10-CM

## 2022-02-05 DIAGNOSIS — Z125 Encounter for screening for malignant neoplasm of prostate: Secondary | ICD-10-CM

## 2022-02-05 DIAGNOSIS — Z131 Encounter for screening for diabetes mellitus: Secondary | ICD-10-CM

## 2022-02-05 DIAGNOSIS — Z0000 Encounter for general adult medical examination without abnormal findings: Secondary | ICD-10-CM

## 2022-02-05 NOTE — Telephone Encounter (Signed)
Patient informed. 

## 2022-02-05 NOTE — Telephone Encounter (Signed)
Caller name:Delvin Lerew   On DPR? :Yes  Call back [phone]  Provider they see: Teresa Coombs   Reason for call:.Pt missed appt today for Phy wants to get blood work ordered before next Phy appt in August

## 2022-02-05 NOTE — Telephone Encounter (Signed)
Patient's last labs were done on 01/12/20 PSA, lipid, A1C, CMP14 +EGFR and CBC.  Has physical scheduled for 02/26/22.

## 2022-02-26 ENCOUNTER — Encounter: Payer: Self-pay | Admitting: Nurse Practitioner

## 2022-02-26 ENCOUNTER — Ambulatory Visit (INDEPENDENT_AMBULATORY_CARE_PROVIDER_SITE_OTHER): Payer: Managed Care, Other (non HMO) | Admitting: Nurse Practitioner

## 2022-02-26 VITALS — BP 138/88 | HR 78 | Ht 71.0 in | Wt 186.6 lb

## 2022-02-26 DIAGNOSIS — Z1159 Encounter for screening for other viral diseases: Secondary | ICD-10-CM

## 2022-02-26 DIAGNOSIS — Z114 Encounter for screening for human immunodeficiency virus [HIV]: Secondary | ICD-10-CM | POA: Diagnosis not present

## 2022-02-26 DIAGNOSIS — Z0000 Encounter for general adult medical examination without abnormal findings: Secondary | ICD-10-CM | POA: Diagnosis not present

## 2022-02-26 DIAGNOSIS — Z1211 Encounter for screening for malignant neoplasm of colon: Secondary | ICD-10-CM

## 2022-02-26 NOTE — Progress Notes (Signed)
Subjective:    Patient ID: Alan Barrera, male    DOB: [birth year], 46 y.o.   MRN: [id number]  HPI  The patient comes in today for a wellness visit.   A review of their health history was completed.  A review of medications was also completed.  Any needed refills; none  Eating habits: good   Falls/  MVA accidents in past few months: none  Regular exercise: yes  Specialist pt sees on regular basis: none  Preventative health issues were discussed.   Additional concerns: none   Review of Systems  All other systems reviewed and are negative.      Objective:   Physical Exam Vitals reviewed. Exam conducted with a chaperone present.  Constitutional:      General: He is not in acute distress.    Appearance: Normal appearance. He is normal weight. He is not ill-appearing, toxic-appearing or diaphoretic.  HENT:     Head: Normocephalic and atraumatic.     Right Ear: Tympanic membrane, ear canal and external ear normal. There is no impacted cerumen.     Left Ear: Tympanic membrane, ear canal and external ear normal. There is no impacted cerumen.     Nose: Nose normal.     Mouth/Throat:     Mouth: Mucous membranes are moist.     Pharynx: Oropharynx is clear. No oropharyngeal exudate or posterior oropharyngeal erythema.  Eyes:     General:        Right eye: No discharge.        Left eye: No discharge.     Extraocular Movements: Extraocular movements intact.     Pupils: Pupils are equal, round, and reactive to light.  Cardiovascular:     Rate and Rhythm: Normal rate and regular rhythm.     Pulses: Normal pulses.     Heart sounds: Normal heart sounds. No murmur heard. Pulmonary:     Effort: Pulmonary effort is normal. No respiratory distress.     Breath sounds: Normal breath sounds. No wheezing.  Abdominal:     General: Abdomen is flat. Bowel sounds are normal. There is no distension.     Palpations: Abdomen is soft. There is no mass.     Tenderness: There is no  abdominal tenderness. There is no right CVA tenderness, left CVA tenderness, guarding or rebound.     Hernia: No hernia is present.  Genitourinary:    Prostate: Normal.  Musculoskeletal:     Cervical back: Normal range of motion and neck supple. No rigidity or tenderness.     Comments: Grossly intact  Lymphadenopathy:     Cervical: No cervical adenopathy.  Skin:    General: Skin is warm.     Capillary Refill: Capillary refill takes less than 2 seconds.  Neurological:     Mental Status: He is alert.     Comments: Grossly intact  Psychiatric:        Mood and Affect: Mood normal.        Behavior: Behavior normal.        Assessment & Plan:   1. Wellness examination Adult wellness-complete.wellness physical was conducted today. Importance of diet and exercise were discussed in detail.  Importance of stress reduction and healthy living were discussed.  In addition to this a discussion regarding safety was also covered.  We also reviewed over immunizations and gave recommendations regarding current immunization needed for age.   In addition to this additional areas were also touched on including: Preventative health  exams needed:  Colonoscopy to be scheduled Tetanus: encouraged to get vaccine from pharmacy We will get routine labs today. Hep C screening done today COVID-vaccine declined  Patient was advised yearly wellness exam   2. Encounter for hepatitis C screening test for low risk patient - Hepatitis C Antibody  3. Encounter for screening for HIV - HIV antibody (with reflex)  4. Screening for colon cancer - Ambulatory referral to Gastroenterology    Note:  This document was prepared using Dragon voice recognition software and may include unintentional dictation errors. Note - This record has been created using [employer].  Chart creation errors have been sought, but may not always  have been located. Such creation errors do not reflect on  the standard of  medical care.

## 2022-02-26 NOTE — Patient Instructions (Addendum)
Please remind lab to get labwork that was ordered on 02/05/2022 and 02/26/2022.

## 2022-02-27 ENCOUNTER — Encounter: Payer: Self-pay | Admitting: *Deleted

## 2022-02-27 LAB — CMP14+EGFR
ALT: 29 IU/L (ref 0–44)
AST: 28 IU/L (ref 0–40)
Albumin/Globulin Ratio: 1.8 (ref 1.2–2.2)
Albumin: 4.4 g/dL (ref 4.1–5.1)
Alkaline Phosphatase: 60 IU/L (ref 44–121)
BUN/Creatinine Ratio: 10 (ref 9–20)
BUN: 10 mg/dL (ref 6–24)
Bilirubin Total: 0.4 mg/dL (ref 0.0–1.2)
CO2: 24 mmol/L (ref 20–29)
Calcium: 9 mg/dL (ref 8.7–10.2)
Chloride: 100 mmol/L (ref 96–106)
Creatinine, Ser: 1.05 mg/dL (ref 0.76–1.27)
Globulin, Total: 2.4 g/dL (ref 1.5–4.5)
Glucose: 89 mg/dL (ref 70–99)
Potassium: 4 mmol/L (ref 3.5–5.2)
Sodium: 140 mmol/L (ref 134–144)
Total Protein: 6.8 g/dL (ref 6.0–8.5)
eGFR: 88 mL/min/{1.73_m2} (ref >59)

## 2022-02-27 LAB — HEMOGLOBIN A1C
Est. average glucose Bld gHb Est-mCnc: 111 mg/dL
Hgb A1c MFr Bld: 5.5 % (ref 4.8–5.6)

## 2022-02-27 LAB — CBC WITH DIFFERENTIAL/PLATELET
Basophils Absolute: 0 10*3/uL (ref 0.0–0.2)
Basos: 1 %
EOS (ABSOLUTE): 0.1 10*3/uL (ref 0.0–0.4)
Eos: 1 %
Hematocrit: 45.8 % (ref 37.5–51.0)
Hemoglobin: 16 g/dL (ref 13.0–17.7)
Immature Grans (Abs): 0 10*3/uL (ref 0.0–0.1)
Immature Granulocytes: 0 %
Lymphocytes Absolute: 1.6 10*3/uL (ref 0.7–3.1)
Lymphs: 33 %
MCH: 31.4 pg (ref 26.6–33.0)
MCHC: 34.9 g/dL (ref 31.5–35.7)
MCV: 90 fL (ref 79–97)
Monocytes Absolute: 0.6 10*3/uL (ref 0.1–0.9)
Monocytes: 13 %
Neutrophils Absolute: 2.5 10*3/uL (ref 1.4–7.0)
Neutrophils: 52 %
Platelets: 177 10*3/uL (ref 150–450)
RBC: 5.1 x10E6/uL (ref 4.14–5.80)
RDW: 12.7 % (ref 11.6–15.4)
WBC: 4.7 10*3/uL (ref 3.4–10.8)

## 2022-02-27 LAB — LIPID PANEL
Chol/HDL Ratio: 4.9 ratio (ref 0.0–5.0)
Cholesterol, Total: 214 mg/dL — ABNORMAL HIGH (ref 100–199)
HDL: 44 mg/dL (ref >39)
LDL Chol Calc (NIH): 119 mg/dL — ABNORMAL HIGH (ref 0–99)
Triglycerides: 294 mg/dL — ABNORMAL HIGH (ref 0–149)
VLDL Cholesterol Cal: 51 mg/dL — ABNORMAL HIGH (ref 5–40)

## 2022-02-27 LAB — HEPATITIS C ANTIBODY: Hep C Virus Ab: NONREACTIVE

## 2022-02-27 LAB — PSA: Prostate Specific Ag, Serum: 0.6 ng/mL (ref 0.0–4.0)

## 2022-02-27 LAB — HIV ANTIBODY (ROUTINE TESTING W REFLEX): HIV Screen 4th Generation wRfx: NONREACTIVE

## 2022-03-04 ENCOUNTER — Telehealth: Payer: Self-pay

## 2022-03-04 NOTE — Telephone Encounter (Signed)
Patient called to request results and recommendations, please advise.

## 2022-03-06 ENCOUNTER — Encounter: Payer: Self-pay | Admitting: Family Medicine

## 2022-03-06 ENCOUNTER — Ambulatory Visit (INDEPENDENT_AMBULATORY_CARE_PROVIDER_SITE_OTHER): Payer: Managed Care, Other (non HMO) | Admitting: Family Medicine

## 2022-03-06 DIAGNOSIS — T8069XA Other serum reaction due to other serum, initial encounter: Secondary | ICD-10-CM | POA: Diagnosis not present

## 2022-03-08 DIAGNOSIS — T8069XA Other serum reaction due to other serum, initial encounter: Secondary | ICD-10-CM | POA: Insufficient documentation

## 2022-03-08 NOTE — Assessment & Plan Note (Signed)
Patient is well appearing. Exam is unremarkable. BP stable. Likely experiencing serum sickness. Advised rest, fluids and close monitoring. Offered corticosteroids (he declined).

## 2022-03-08 NOTE — Progress Notes (Signed)
Subjective:  Patient ID: Alan Barrera, male    DOB: [birth year]  Age: 46 y.o. MRN: [id number]  CC: Chief Complaint  Patient presents with   Dizziness    Pt bee stung on Sunday. Monday began to have hot flashes, dizziness and blurred vision. Pt states blood pressure became elevated(checked in with after hour nurse). Pt states at this time his whole body feels weird.     HPI:  46 year old male presents for evaluation of the above.   Patient reports getting stung by a yellow jacket on Sunday. He states that his finger was swollen and he didn't feel very well. Since that time he has had ongoing dizziness, hot flashes, nausea. Overall, has felt unwell. BP has been elevated as well. Has been seen by a local urgent care and went to the ED as well (but left before being seen).  Patient Active Problem List   Diagnosis Date Noted   Serum sickness 03/08/2022   Ventral hernia without obstruction or gangrene 06/14/2020   Erectile dysfunction 09/05/2018   Generalized anxiety disorder 03/30/2017    Social Hx   Social History   Socioeconomic History   Marital status: Single    Spouse name: Not on file   Number of children: Not on file   Years of education: Not on file   Highest education level: Not on file  Occupational History   Not on file  Tobacco Use   Smoking status: Never   Smokeless tobacco: Former    Types: Chew    Quit date: 05/22/2019  Vaping Use   Vaping Use: Never used  Substance and Sexual Activity   Alcohol use: No   Drug use: Never   Sexual activity: Yes  Other Topics Concern   Not on file  Social History Narrative   Not on file   Social Determinants of Health   Financial Resource Strain: Not on file  Food Insecurity: Not on file  Transportation Needs: Not on file  Physical Activity: Not on file  Stress: Not on file  Social Connections: Not on file    Review of Systems Per HPI  Objective:  BP 136/85   Pulse 88   Temp 98 F (36.7 C)   Wt 190 lb  3.2 oz (86.3 kg)   SpO2 98%   BMI 26.53 kg/m      03/06/2022    3:34 PM 02/26/2022    2:10 PM 01/15/2022    3:00 PM  BP/Weight  Systolic BP 136 138 134  Diastolic BP 85 88 88  Wt. (Lbs) 190.2 186.6   BMI 26.53 kg/m2 26.03 kg/m2     Physical Exam Constitutional:      General: He is not in acute distress.    Appearance: Normal appearance.  HENT:     Head: Normocephalic and atraumatic.  Cardiovascular:     Rate and Rhythm: Normal rate and regular rhythm.  Pulmonary:     Effort: Pulmonary effort is normal.     Breath sounds: Normal breath sounds.  Neurological:     Mental Status: He is alert.  Psychiatric:     Comments: Anxious.     Lab Results  Component Value Date   WBC 4.7 02/26/2022   HGB 16.0 02/26/2022   HCT 45.8 02/26/2022   PLT 177 02/26/2022   GLUCOSE 89 02/26/2022   CHOL 214 (H) 02/26/2022   TRIG 294 (H) 02/26/2022   HDL 44 02/26/2022   LDLCALC 119 (H) 02/26/2022   ALT  29 02/26/2022   AST 28 02/26/2022   NA 140 02/26/2022   K 4.0 02/26/2022   CL 100 02/26/2022   CREATININE 1.05 02/26/2022   BUN 10 02/26/2022   CO2 24 02/26/2022   HGBA1C 5.5 02/26/2022     Assessment & Plan:   Problem List Items Addressed This Visit       Other   Serum sickness    Patient is well appearing. Exam is unremarkable. BP stable. Likely experiencing serum sickness. Advised rest, fluids and close monitoring. Offered corticosteroids (he declined).         Everlene Other DO [facility]

## 2022-05-11 ENCOUNTER — Other Ambulatory Visit: Payer: Self-pay | Admitting: Nurse Practitioner

## 2022-05-11 ENCOUNTER — Telehealth: Payer: Self-pay

## 2022-05-11 DIAGNOSIS — F411 Generalized anxiety disorder: Secondary | ICD-10-CM

## 2022-05-11 MED ORDER — CITALOPRAM HYDROBROMIDE 40 MG PO TABS: 40.0000 mg | ORAL_TABLET | Freq: Every day | ORAL | 1 refills | Status: DC

## 2022-05-11 NOTE — Telephone Encounter (Signed)
Med sent in - had cpe in 02/2022.

## 2022-05-11 NOTE — Telephone Encounter (Signed)
Encourage patient to contact the pharmacy for refills or they can request refills through [facility]  (Please schedule appointment if patient has not been seen in over a year)    [place] TO: [place], [place] - [address]  [address], [place] [address]   MEDICATION NAME & DOSE:citalopram (Toxey) 40 MG tablet   NOTES/COMMENTS FROM PATIENT:      [place] office please notify patient: It takes 48-72 hours to process rx refill requests Ask patient to call pharmacy to ensure rx is ready before heading there.

## 2022-06-26 ENCOUNTER — Telehealth: Payer: Managed Care, Other (non HMO) | Admitting: Family Medicine

## 2022-06-26 DIAGNOSIS — R3989 Other symptoms and signs involving the genitourinary system: Secondary | ICD-10-CM

## 2022-06-26 NOTE — Progress Notes (Signed)
Needs STI testing- new partner and sores.  [place]

## 2022-07-02 ENCOUNTER — Ambulatory Visit: Payer: Managed Care, Other (non HMO) | Admitting: Family Medicine

## 2022-07-21 ENCOUNTER — Ambulatory Visit
Admission: EM | Admit: 2022-07-21 | Discharge: 2022-07-21 | Discharge status: Against Medical Advice | Payer: Managed Care, Other (non HMO)

## 2022-07-22 ENCOUNTER — Ambulatory Visit (INDEPENDENT_AMBULATORY_CARE_PROVIDER_SITE_OTHER): Payer: Managed Care, Other (non HMO) | Admitting: Family Medicine

## 2022-07-22 VITALS — BP 112/82 | HR 91 | Temp 97.1°F | Ht 71.0 in | Wt 190.0 lb

## 2022-07-22 DIAGNOSIS — N5201 Erectile dysfunction due to arterial insufficiency: Secondary | ICD-10-CM | POA: Diagnosis not present

## 2022-07-22 DIAGNOSIS — R21 Rash and other nonspecific skin eruption: Secondary | ICD-10-CM | POA: Insufficient documentation

## 2022-07-22 DIAGNOSIS — F411 Generalized anxiety disorder: Secondary | ICD-10-CM | POA: Diagnosis not present

## 2022-07-22 MED ORDER — SILDENAFIL CITRATE 20 MG PO TABS: ORAL_TABLET | ORAL | 1 refills | Status: DC

## 2022-07-22 MED ORDER — CITALOPRAM HYDROBROMIDE 40 MG PO TABS: 40.0000 mg | ORAL_TABLET | Freq: Every day | ORAL | 1 refills | Status: DC

## 2022-07-22 MED ORDER — DESONIDE 0.05 % EX OINT: 1.0000 | TOPICAL_OINTMENT | Freq: Two times a day (BID) | CUTANEOUS | 0 refills | Status: DC

## 2022-07-22 NOTE — Progress Notes (Signed)
Subjective:  Patient ID: Alan Barrera, male    DOB: February 05, 1976  Age: 46 y.o. MRN: [id number]  CC: Chief Complaint  Patient presents with   Rash    Redness on private shower causes irritation, peeling x 1 month     HPI:  46 year old male presents with a penile rash.  Patient reports that he has had a rash to the head of the penis and just below the head of the penis.  Denies pain.  Reports irritation.  No itching.  He states that it is dry and seems to peel.  He has some discomfort with bathing.  No known inciting factor.  No new contacts or exposures.  Does not use lubricant for intercourse.  Does not use condoms.  No relieving factors.  Patient Active Problem List   Diagnosis Date Noted   Penile rash 07/22/2022   Ventral hernia without obstruction or gangrene 06/14/2020   Erectile dysfunction 09/05/2018   Generalized anxiety disorder 03/30/2017    Social Hx   Social History   Socioeconomic History   Marital status: Single    Spouse name: Not on file   Number of children: Not on file   Years of education: Not on file   Highest education level: Not on file  Occupational History   Not on file  Tobacco Use   Smoking status: Never   Smokeless tobacco: Former    Types: Chew    Quit date: 05/22/2019  Vaping Use   Vaping Use: Never used  Substance and Sexual Activity   Alcohol use: No   Drug use: Never   Sexual activity: Yes  Other Topics Concern   Not on file  Social History Narrative   Not on file   Social Determinants of Health   Financial Resource Strain: Not on file  Food Insecurity: Not on file  Transportation Needs: Not on file  Physical Activity: Not on file  Stress: Not on file  Social Connections: Not on file    Review of Systems Per HPI  Objective:  BP 112/82   Pulse 91   Temp (!) 97.1 F (36.2 C)   Ht 5\' 11"  (1.803 m)   Wt 190 lb (86.2 kg)   SpO2 99%   BMI 26.50 kg/m      07/22/2022    3:26 PM 03/06/2022    3:34 PM 02/26/2022     2:10 PM  BP/Weight  Systolic BP 112 136 138  Diastolic BP 82 85 88  Wt. (Lbs) 190 190.2 186.6  BMI 26.5 kg/m2 26.53 kg/m2 26.03 kg/m2    Physical Exam Constitutional:      General: He is not in acute distress.    Appearance: Normal appearance.  HENT:     Head: Normocephalic and atraumatic.  Pulmonary:     Effort: Pulmonary effort is normal. No respiratory distress.  Genitourinary:      Comments: Erythema, and dryness with slight scale noted at the labeled locations. Neurological:     Mental Status: He is alert.  Psychiatric:        Mood and Affect: Mood normal.        Behavior: Behavior normal.     Lab Results  Component Value Date   WBC 4.7 02/26/2022   HGB 16.0 02/26/2022   HCT 45.8 02/26/2022   PLT 177 02/26/2022   GLUCOSE 89 02/26/2022   CHOL 214 (H) 02/26/2022   TRIG 294 (H) 02/26/2022   HDL 44 02/26/2022   LDLCALC 119 (H) 02/26/2022  ALT 29 02/26/2022   AST 28 02/26/2022   NA 140 02/26/2022   K 4.0 02/26/2022   CL 100 02/26/2022   CREATININE 1.05 02/26/2022   BUN 10 02/26/2022   CO2 24 02/26/2022   HGBA1C 5.5 02/26/2022     Assessment & Plan:   Problem List Items Addressed This Visit       Musculoskeletal and Integument   Penile rash - Primary    Etiology unclear at this time.  Does not appear to be consistent with HSV, syphilis.  He is circumcised.  This is not consistent with balanitis.  Possible lichen planus or psoriasis although uncommon.  Referring to dermatology.  Topical desonide.      Relevant Orders   Ambulatory referral to Dermatology     Other   Erectile dysfunction   Relevant Medications   sildenafil (REVATIO) 20 MG tablet   Generalized anxiety disorder   Relevant Medications   citalopram (CELEXA) 40 MG tablet    Meds ordered this encounter  Medications   citalopram (CELEXA) 40 MG tablet    Sig: Take 1 tablet (40 mg total) by mouth daily.    Dispense:  90 tablet    Refill:  1   sildenafil (REVATIO) 20 MG tablet     Sig: Take 3 tablets po 1 -2 hours prior to sex    Dispense:  90 tablet    Refill:  1   desonide (DESOWEN) 0.05 % ointment    Sig: Apply 1 Application topically 2 (two) times daily.    Dispense:  60 g    Refill:  0    Follow-up:  Return if symptoms worsen or fail to improve.  Everlene Other DO [facility]

## 2022-07-22 NOTE — Assessment & Plan Note (Signed)
Etiology unclear at this time.  Does not appear to be consistent with HSV, syphilis.  He is circumcised.  This is not consistent with balanitis.  Possible lichen planus or psoriasis although uncommon.  Referring to dermatology.  Topical desonide.

## 2022-07-22 NOTE — Patient Instructions (Signed)
Medication as prescribed.  Referral to dermatology placed.   Call with concerns.  Take care  Dr. Adriana Simas

## 2022-07-23 ENCOUNTER — Ambulatory Visit: Payer: Managed Care, Other (non HMO) | Admitting: Family Medicine

## 2022-09-03 ENCOUNTER — Other Ambulatory Visit: Payer: Self-pay | Admitting: Family Medicine

## 2022-09-03 ENCOUNTER — Telehealth: Payer: Self-pay | Admitting: Family Medicine

## 2022-09-03 MED ORDER — TRIAMCINOLONE ACETONIDE 0.1 % EX CREA: 1.0000 | TOPICAL_CREAM | Freq: Every day | CUTANEOUS | 0 refills | Status: DC

## 2022-09-03 NOTE — Telephone Encounter (Signed)
Left message to return call. Also sent my chart message °

## 2022-09-03 NOTE — Telephone Encounter (Signed)
[place] office states they did not receive it because it is not in their system. Will fax referral to them

## 2022-09-03 NOTE — Telephone Encounter (Signed)
Pt is OK with having something sent in to [facility]. Referral has been faxed to [place] office. Please advise. Thank you

## 2022-09-03 NOTE — Telephone Encounter (Signed)
Pt called in and states that penile rash has improved but still has a little rash that will not go away. Pt states the cream did help but is needing to know what the next step is. (Pt is out of town and will be back in the weekend). Please advise. Thank you  [facility].

## 2022-09-07 NOTE — Telephone Encounter (Signed)
Please see mychart message.

## 2022-11-16 ENCOUNTER — Ambulatory Visit (INDEPENDENT_AMBULATORY_CARE_PROVIDER_SITE_OTHER): Payer: Managed Care, Other (non HMO) | Admitting: Family Medicine

## 2022-11-16 DIAGNOSIS — L404 Guttate psoriasis: Secondary | ICD-10-CM | POA: Insufficient documentation

## 2022-11-16 DIAGNOSIS — F411 Generalized anxiety disorder: Secondary | ICD-10-CM

## 2022-11-16 DIAGNOSIS — N5201 Erectile dysfunction due to arterial insufficiency: Secondary | ICD-10-CM | POA: Diagnosis not present

## 2022-11-16 DIAGNOSIS — I10 Essential (primary) hypertension: Secondary | ICD-10-CM | POA: Diagnosis not present

## 2022-11-16 DIAGNOSIS — R21 Rash and other nonspecific skin eruption: Secondary | ICD-10-CM | POA: Insufficient documentation

## 2022-11-16 MED ORDER — VALSARTAN 80 MG PO TABS: 80.0000 mg | ORAL_TABLET | Freq: Every day | ORAL | 3 refills | Status: DC

## 2022-11-16 MED ORDER — SILDENAFIL CITRATE 20 MG PO TABS: ORAL_TABLET | ORAL | 1 refills | Status: DC

## 2022-11-16 MED ORDER — CITALOPRAM HYDROBROMIDE 40 MG PO TABS: 40.0000 mg | ORAL_TABLET | Freq: Every day | ORAL | 1 refills | Status: DC

## 2022-11-16 MED ORDER — TRIAMCINOLONE ACETONIDE 0.1 % EX CREA: 1.0000 | TOPICAL_CREAM | Freq: Every day | CUTANEOUS | 0 refills | Status: DC

## 2022-11-16 MED ORDER — CLOBETASOL PROPIONATE 0.05 % EX OINT: 1.0000 | TOPICAL_OINTMENT | Freq: Two times a day (BID) | CUTANEOUS | 0 refills | Status: DC

## 2022-11-16 NOTE — Assessment & Plan Note (Signed)
New diagnosis.  Starting on valsartan.  Labs ordered.

## 2022-11-16 NOTE — Patient Instructions (Signed)
Medications as prescribed.  Keep an eye on BP.  Follow up in 3 months.

## 2022-11-16 NOTE — Progress Notes (Signed)
Subjective:  Patient ID: Alan Barrera, male    DOB: 1976-05-31  Age: 47 y.o. MRN: [id number]  CC: Chief Complaint  Patient presents with   elevated BP readings at home    157/98, 136/96, 134/90, 129/89, 143/100   dermatology on lower legs    4 weeks need referral    HPI:  47 year old male presents for evaluation of the above.  Patient reports that his blood pressure has been running high at home.  He is feeling well.  He is concerned about his blood pressure readings.  He would like to discuss this today.  Patient also reports recent development of rash on his lower legs.  He states that this been going on for the past month.  Areas are itchy and red.  No known inciting factor.  No new changes or exposures.  He would like a referral to dermatology.  He is currently using the topical triamcinolone without improvement.  Patient Active Problem List   Diagnosis Date Noted   Rash 11/16/2022   Essential hypertension 11/16/2022   Ventral hernia without obstruction or gangrene 06/14/2020   Erectile dysfunction 09/05/2018   Generalized anxiety disorder 03/30/2017    Social Hx   Social History   Socioeconomic History   Marital status: Single    Spouse name: Not on file   Number of children: Not on file   Years of education: Not on file   Highest education level: Not on file  Occupational History   Not on file  Tobacco Use   Smoking status: Never   Smokeless tobacco: Former    Types: Chew    Quit date: 05/22/2019  Vaping Use   Vaping Use: Never used  Substance and Sexual Activity   Alcohol use: No   Drug use: Never   Sexual activity: Yes  Other Topics Concern   Not on file  Social History Narrative   Not on file   Social Determinants of Health   Financial Resource Strain: Not on file  Food Insecurity: Not on file  Transportation Needs: Not on file  Physical Activity: Not on file  Stress: Not on file  Social Connections: Not on file    Review of  Systems Per HPI  Objective:  BP (!) 139/94   Pulse 67   Temp 98.4 F (36.9 C)   Ht  (1.803 m)   Wt 189 lb (85.7 kg)   SpO2 97%   BMI 26.36 kg/m      11/16/2022   11:04 AM 07/22/2022    3:26 PM 03/06/2022    3:34 PM  BP/Weight  Systolic BP 139 112 136  Diastolic BP 94 82 85  Wt. (Lbs) 189 190 190.2  BMI 26.36 kg/m2 26.5 kg/m2 26.53 kg/m2    Physical Exam Vitals and nursing note reviewed.  Constitutional:      General: He is not in acute distress.    Appearance: Normal appearance.  HENT:     Head: Normocephalic and atraumatic.  Cardiovascular:     Rate and Rhythm: Normal rate and regular rhythm.  Pulmonary:     Effort: Pulmonary effort is normal.     Breath sounds: Normal breath sounds. No wheezing, rhonchi or rales.  Skin:    Comments: Lower legs with erythematous scaly plaques.  Neurological:     Mental Status: He is alert.  Psychiatric:        Mood and Affect: Mood normal.        Behavior: Behavior normal.  Lab Results  Component Value Date   WBC 4.7 02/26/2022   HGB 16.0 02/26/2022   HCT 45.8 02/26/2022   PLT 177 02/26/2022   GLUCOSE 89 02/26/2022   CHOL 214 (H) 02/26/2022   TRIG 294 (H) 02/26/2022   HDL 44 02/26/2022   LDLCALC 119 (H) 02/26/2022   ALT 29 02/26/2022   AST 28 02/26/2022   NA 140 02/26/2022   K 4.0 02/26/2022   CL 100 02/26/2022   CREATININE 1.05 02/26/2022   BUN 10 02/26/2022   CO2 24 02/26/2022   HGBA1C 5.5 02/26/2022     Assessment & Plan:   Problem List Items Addressed This Visit       Cardiovascular and Mediastinum   Essential hypertension    New diagnosis.  Starting on valsartan.  Labs ordered.      Relevant Medications   sildenafil (REVATIO) 20 MG tablet   valsartan (DIOVAN) 80 MG tablet   Other Relevant Orders   Basic Metabolic Panel     Musculoskeletal and Integument   Rash    Concern for psoriasis.  Topical clobetasol.  Referring to dermatology.      Relevant Orders   Ambulatory referral to  Dermatology     Other   Erectile dysfunction   Relevant Medications   sildenafil (REVATIO) 20 MG tablet   Generalized anxiety disorder    Stable.  Celexa refilled.      Relevant Medications   citalopram (CELEXA) 40 MG tablet    Meds ordered this encounter  Medications   citalopram (CELEXA) 40 MG tablet    Sig: Take 1 tablet (40 mg total) by mouth daily.    Dispense:  90 tablet    Refill:  1   sildenafil (REVATIO) 20 MG tablet    Sig: Take 3 tablets po 1 -2 hours prior to sex    Dispense:  90 tablet    Refill:  1   triamcinolone cream (KENALOG) 0.1 %    Sig: Apply 1 Application topically daily.    Dispense:  30 g    Refill:  0   clobetasol ointment (TEMOVATE) 0.05 %    Sig: Apply 1 Application topically 2 (two) times daily.    Dispense:  60 g    Refill:  0   valsartan (DIOVAN) 80 MG tablet    Sig: Take 1 tablet (80 mg total) by mouth daily.    Dispense:  90 tablet    Refill:  3    Follow-up:  Return in about 3 months (around 02/15/2023).  Everlene Other DO [facility]

## 2022-11-16 NOTE — Assessment & Plan Note (Signed)
Stable. Celexa refilled. 

## 2022-11-16 NOTE — Assessment & Plan Note (Signed)
Concern for psoriasis.  Topical clobetasol.  Referring to dermatology.

## 2022-11-18 LAB — BASIC METABOLIC PANEL
BUN/Creatinine Ratio: 11 (ref 9–20)
BUN: 11 mg/dL (ref 6–24)
CO2: 22 mmol/L (ref 20–29)
Calcium: 9.7 mg/dL (ref 8.7–10.2)
Chloride: 102 mmol/L (ref 96–106)
Creatinine, Ser: 0.96 mg/dL (ref 0.76–1.27)
Glucose: 131 mg/dL — ABNORMAL HIGH (ref 70–99)
Potassium: 4.4 mmol/L (ref 3.5–5.2)
Sodium: 139 mmol/L (ref 134–144)
eGFR: 98 mL/min/{1.73_m2} (ref >59)

## 2022-11-23 ENCOUNTER — Encounter: Payer: Self-pay | Admitting: Family Medicine

## 2022-12-08 ENCOUNTER — Other Ambulatory Visit: Payer: Self-pay | Admitting: Family Medicine

## 2022-12-08 MED ORDER — PREDNISONE 10 MG PO TABS: ORAL_TABLET | ORAL | 0 refills | Status: DC

## 2022-12-24 ENCOUNTER — Telehealth: Payer: Self-pay | Admitting: *Deleted

## 2022-12-24 ENCOUNTER — Telehealth: Payer: Self-pay | Admitting: Internal Medicine

## 2022-12-24 NOTE — Telephone Encounter (Signed)
  Procedure: colonoscopy  Height: 5'11" Weight: 190      BMI: 26.5  Have you had a colonoscopy before?  No  Do you have family history of colon cancer?  No  Do you have a family history of polyps? No  Previous colonoscopy with polyps removed? No  Do you have a history colorectal cancer?   No  Are you diabetic?  No  Do you have a prosthetic or mechanical heart valve? No  Do you have a pacemaker/defibrillator?   No  Have you had endocarditis/atrial fibrillation?  No  Do you use supplemental oxygen/CPAP?  No  Have you had joint replacement within the last 12 months?  No  Do you tend to be constipated or have to use laxatives?  No   Do you have history of alcohol use? If yes, how much and how often.  No  Do you have history or are you using drugs? If yes, what do are you  using?  No  Have you ever had a stroke/heart attack?  No  Have you ever had a heart or other vascular stent placed,?No  Do you take weight loss medication? No  Do you take any blood-thinning medications such as: (Plavix, aspirin, Coumadin, Aggrenox, Brilinta, Xarelto, Eliquis, Pradaxa, Savaysa or Effient)? No  If yes we need the name, milligram, dosage and who is prescribing doctor:  n/a             Current Outpatient Medications  Medication Sig Dispense Refill   citalopram (CELEXA) 40 MG tablet Take 1 tablet (40 mg total) by mouth daily. 90 tablet 1   sildenafil (REVATIO) 20 MG tablet Take 3 tablets po 1 -2 hours prior to sex 90 tablet 1   valsartan (DIOVAN) 80 MG tablet Take 1 tablet (80 mg total) by mouth daily. 90 tablet 3   No current facility-administered medications for this visit.    Allergies  Allergen Reactions   Hydrocodone     Pt states it makes him sick

## 2022-12-24 NOTE — Telephone Encounter (Signed)
Received questionnaire and sent to provider to review.

## 2022-12-29 ENCOUNTER — Encounter: Payer: Self-pay | Admitting: Family Medicine

## 2022-12-29 DIAGNOSIS — Z1211 Encounter for screening for malignant neoplasm of colon: Secondary | ICD-10-CM

## 2022-12-30 NOTE — Telephone Encounter (Signed)
Alan Sams, DO     Please put in referral to [facility] Dr. Tobi Bastos.

## 2023-01-05 ENCOUNTER — Other Ambulatory Visit: Payer: Self-pay

## 2023-01-05 ENCOUNTER — Telehealth: Payer: Self-pay

## 2023-01-05 DIAGNOSIS — Z1211 Encounter for screening for malignant neoplasm of colon: Secondary | ICD-10-CM

## 2023-01-05 MED ORDER — NA SULFATE-K SULFATE-MG SULF 17.5-3.13-1.6 GM/177ML PO SOLN: 1.0000 | Freq: Once | ORAL | 0 refills | Status: AC

## 2023-01-05 NOTE — Telephone Encounter (Signed)
Nurses Dr. Adriana Simas is out of the office this week Certainly sympathetic to what is going on with the patient Hard to know if the medication is linked in with his psoriasis flareup or not Does seem reasonable to taper off of the medicine Very important to make sure blood pressure stays in a good range Target blood pressure would be in the ballpark of 130/80 It would be reasonable to reduce the dose of valsartan and 1/2 tablet for the next 7 days If blood pressure starts to go up-140s over 90s then it would be imperative for him to be on a different blood pressure medicine Blood pressures look excellent then he can stop the medication and will continue to follow with blood pressures It would be wise to follow-up with Dr. Adriana Simas in approximately 3 weeks into this approach-in other words 3 weeks from now-Alan Barrera may well benefit from trying different blood pressure medicine if his blood pressure remains elevated.  1 option would be amlodipine.  Please inform [name]-thanks-Dr. Lorin Picket

## 2023-01-05 NOTE — Telephone Encounter (Signed)
Gastroenterology Pre-Procedure Review  Request Date: 02/17/23 Requesting Physician: Dr. Tobi Bastos  PATIENT REVIEW QUESTIONS: The patient responded to the following health history questions as indicated:    1. Are you having any GI issues? no 2. Do you have a personal history of Polyps? no 3. Do you have a family history of Colon Cancer or Polyps? no 4. Diabetes Mellitus? no 5. Joint replacements in the past 12 months?no 6. Major health problems in the past 3 months?no 7. Any artificial heart valves, MVP, or defibrillator?no    MEDICATIONS & ALLERGIES:    Patient reports the following regarding taking any anticoagulation/antiplatelet therapy:   Plavix, Coumadin, Eliquis, Xarelto, Lovenox, Pradaxa, Brilinta, or Effient? no Aspirin? no  Patient confirms/reports the following medications:  Current Outpatient Medications  Medication Sig Dispense Refill   citalopram (CELEXA) 40 MG tablet Take 1 tablet (40 mg total) by mouth daily. 90 tablet 1   sildenafil (REVATIO) 20 MG tablet Take 3 tablets po 1 -2 hours prior to sex 90 tablet 1   valsartan (DIOVAN) 80 MG tablet Take 1 tablet (80 mg total) by mouth daily. 90 tablet 3   No current facility-administered medications for this visit.    Patient confirms/reports the following allergies:  Allergies  Allergen Reactions   Hydrocodone     Pt states it makes him sick    No orders of the defined types were placed in this encounter.   AUTHORIZATION INFORMATION Primary Insurance: 1D#: Group #:  Secondary Insurance: 1D#: Group #:  SCHEDULE INFORMATION: Date: 02/17/23 Time: Location: [facility]

## 2023-01-06 ENCOUNTER — Telehealth: Payer: Self-pay

## 2023-01-06 NOTE — Telephone Encounter (Signed)
See mychart message , spoke with patient and informed him, patient also wanted to know about weather there is some type of blood work he can do about his skin flares, please advise.

## 2023-01-11 ENCOUNTER — Encounter: Payer: Self-pay | Admitting: Family Medicine

## 2023-01-12 ENCOUNTER — Other Ambulatory Visit: Payer: Self-pay | Admitting: Family Medicine

## 2023-01-12 DIAGNOSIS — R21 Rash and other nonspecific skin eruption: Secondary | ICD-10-CM

## 2023-01-13 ENCOUNTER — Telehealth: Payer: Self-pay | Admitting: Gastroenterology

## 2023-01-13 NOTE — Telephone Encounter (Signed)
Patient girlfriend Kendal Hymen) called in to see if they can reschedule his procedure. The days they are available is 07/17, 08/28 and Wednesday in September.

## 2023-01-13 NOTE — Telephone Encounter (Signed)
Returned phone call to patient directly girlfriend Kendal Hymen) not listed on DPR but [employer] is listed. Left voice message for patient to call me back to discuss rescheduling his colonoscopy.  03/24/23 is currently available for possible reschedule.  Will await call back from patient.  Thanks,  [place], [place]

## 2023-01-13 NOTE — Telephone Encounter (Signed)
Patient called back at 3:34 pm to speak to you I advise the patient I will send you a message.

## 2023-01-19 ENCOUNTER — Telehealth: Payer: Self-pay

## 2023-01-19 NOTE — Telephone Encounter (Signed)
Call returned to patient to reschedule his colonoscopy.  Colonoscopy has been rescheduled to 05/12/23. Verlon Au in Endo has been notified of date change.  Referral updated.  Instructions updated.  Thanks,  Marcelino Duster CMA

## 2023-01-19 NOTE — Telephone Encounter (Signed)
Pt left vmm tto reschedule colonoscopy please return call

## 2023-01-19 NOTE — Telephone Encounter (Signed)
Pt left vmm

## 2023-01-22 NOTE — Telephone Encounter (Signed)
error 

## 2023-01-26 ENCOUNTER — Other Ambulatory Visit: Payer: Self-pay | Admitting: Family Medicine

## 2023-01-26 ENCOUNTER — Encounter: Payer: Self-pay | Admitting: Family

## 2023-01-26 ENCOUNTER — Ambulatory Visit (INDEPENDENT_AMBULATORY_CARE_PROVIDER_SITE_OTHER): Payer: Managed Care, Other (non HMO) | Admitting: Family

## 2023-01-26 VITALS — BP 132/78 | HR 83 | Temp 97.6°F | Ht 69.5 in | Wt 185.6 lb

## 2023-01-26 DIAGNOSIS — I10 Essential (primary) hypertension: Secondary | ICD-10-CM | POA: Diagnosis not present

## 2023-01-26 DIAGNOSIS — L404 Guttate psoriasis: Secondary | ICD-10-CM | POA: Diagnosis not present

## 2023-01-26 DIAGNOSIS — Z8249 Family history of ischemic heart disease and other diseases of the circulatory system: Secondary | ICD-10-CM | POA: Diagnosis not present

## 2023-01-26 DIAGNOSIS — F411 Generalized anxiety disorder: Secondary | ICD-10-CM | POA: Diagnosis not present

## 2023-01-26 MED ORDER — CITALOPRAM HYDROBROMIDE 10 MG PO TABS
10.0000 mg | ORAL_TABLET | Freq: Every day | ORAL | 3 refills | Status: AC
Start: 2023-01-26 — End: ?

## 2023-01-26 NOTE — Patient Instructions (Addendum)
Abdominal ultrasound to screen for AAA  (abdominal aortic aneurysm)  You may decrease Celexa to 10 mg daily as discussed  Nice to  meet you!

## 2023-01-26 NOTE — Assessment & Plan Note (Signed)
Chronic, improving.  Following with [facility] dermatology.  Will follow.

## 2023-01-26 NOTE — Assessment & Plan Note (Signed)
Chronic, stable.  Patient currently on Celexa 20 mg daily and interested in decreasing.  I have sent in Celexa 10 mg daily.  Patient will let me know how he is doing

## 2023-01-26 NOTE — Assessment & Plan Note (Signed)
Chronic, stable. Continue valsartan 80mg daily. 

## 2023-01-26 NOTE — Progress Notes (Signed)
Assessment & Plan:  Essential hypertension Assessment & Plan: Chronic, stable.  Continue valsartan 80 mg daily   Guttate psoriasis Assessment & Plan: Chronic, improving.  Following with [facility] dermatology.  Will follow.    Family history of abdominal aortic aneurysm (AAA) Assessment & Plan: Father passed away from rupture of AAA.  Advised screening with ultrasound.  Patient politely declines today, and he will consider this in the future. Patient is nonsmoker.    Generalized anxiety disorder Assessment & Plan: Chronic, stable.  Patient currently on Celexa 20 mg daily and interested in decreasing.  I have sent in Celexa 10 mg daily.  Patient will let me know how he is doing  Orders: -     Citalopram Hydrobromide; Take 1 tablet (10 mg total) by mouth daily.  Dispense: 90 tablet; Refill: 3     Return precautions given.   Risks, benefits, and alternatives of the medications and treatment plan prescribed today were discussed, and patient expressed understanding.   Education regarding symptom management and diagnosis given to patient on AVS either electronically or printed.  Return in about 6 months (around 07/29/2023).  Alan Plowman, FNP  Subjective:    Patient ID: Alan Barrera, male    DOB: 05/14/1976, 47 y.o.   MRN: [id number]  CC: Barrera RILE is a 47 y.o. male who presents today to establish care/ TOC from Dr Adriana Simas.    HPI: Accompanied by wife  He feels well today.  No new complaints.  He is compliant with Celexa 20 mg daily.  Self decreased from celexa 40mg  every day as he did not think he needed.  Anxiety is very well-controlled.  He is interested in discontinuing completely  Pruritic Rash started 04/2022, dx with guttate psoriasis  Had been seeing [facility] and since has established with [facility], Dr Leim Fabry, compliant with calcipotriene, clobestasol, tacrolimus.     Allergies: Codeine and Hydrocodone Current Outpatient  Medications on File Prior to Visit  Medication Sig Dispense Refill   sildenafil (REVATIO) 20 MG tablet Take 3 tablets po 1 -2 hours prior to sex 90 tablet 1   valsartan (DIOVAN) 80 MG tablet Take 1 tablet (80 mg total) by mouth daily. 90 tablet 3   No current facility-administered medications on file prior to visit.    Review of Systems  Constitutional:  Negative for chills and fever.  Respiratory:  Negative for cough.   Cardiovascular:  Negative for chest pain and palpitations.  Gastrointestinal:  Negative for nausea and vomiting.  Skin:  Positive for rash.      Objective:    BP 132/78   Pulse 83   Temp 97.6 F (36.4 C) (Oral)   Ht 5' 9.5" (1.765 m)   Wt 185 lb 9.6 oz (84.2 kg)   SpO2 96%   BMI 27.02 kg/m  BP Readings from Last 3 Encounters:  01/26/23 132/78  11/16/22 (!) 139/94  07/22/22 112/82   Wt Readings from Last 3 Encounters:  01/26/23 185 lb 9.6 oz (84.2 kg)  11/16/22 189 lb (85.7 kg)  07/22/22 190 lb (86.2 kg)    Physical Exam Vitals reviewed.  Constitutional:      Appearance: He is well-developed.  Cardiovascular:     Rate and Rhythm: Regular rhythm.     Heart sounds: Normal heart sounds.  Pulmonary:     Effort: Pulmonary effort is normal. No respiratory distress.     Breath sounds: Normal breath sounds. No wheezing, rhonchi or rales.  Abdominal:  Comments: No abdominal bruit  Skin:    General: Skin is warm and dry.  Neurological:     Mental Status: He is alert.  Psychiatric:        Speech: Speech normal.        Behavior: Behavior normal.

## 2023-01-26 NOTE — Assessment & Plan Note (Signed)
Father passed away from rupture of AAA.  Advised screening with ultrasound.  Patient politely declines today, and he will consider this in the future. Patient is nonsmoker.

## 2023-02-01 ENCOUNTER — Encounter: Payer: Self-pay | Admitting: Family

## 2023-02-10 NOTE — Telephone Encounter (Addendum)
I called the patient to inform he that we received his message regarding being billed as an established patient. I stated his information was sent to the coders for review and they will send his information to billing to be rebilled as a new patient. I asked if he wanted to schedule his physical. He stated that he may have to change it later if we schedule now. I stated that he could call back when he looks at his schedule. He asked if he could schedule through MyChart. I stated yes.

## 2023-02-12 NOTE — Telephone Encounter (Signed)
NOTED

## 2023-02-14 NOTE — Telephone Encounter (Signed)
This patient is now scheduled for colonoscopy in October with [facility] at [facility]. He can continue care with them.

## 2023-02-15 NOTE — Telephone Encounter (Signed)
noted 

## 2023-03-01 ENCOUNTER — Encounter: Payer: Managed Care, Other (non HMO) | Admitting: Family Medicine

## 2023-03-11 ENCOUNTER — Encounter: Payer: Managed Care, Other (non HMO) | Admitting: Family Medicine

## 2023-03-22 ENCOUNTER — Ambulatory Visit: Payer: Managed Care, Other (non HMO) | Admitting: Family

## 2023-04-06 ENCOUNTER — Telehealth: Payer: Self-pay | Admitting: Gastroenterology

## 2023-04-06 ENCOUNTER — Other Ambulatory Visit: Payer: Self-pay

## 2023-04-06 ENCOUNTER — Telehealth: Payer: Self-pay

## 2023-04-06 NOTE — Telephone Encounter (Signed)
Phone call has been returned to reschedule patients colonoscopy.  His colonoscopy has been rescheduled to 06/04/23.  Referral updated.  Instructions updated.  Penny in Endo notified.  Thanks,  [place], [place]

## 2023-04-06 NOTE — Telephone Encounter (Signed)
Patient called in to reschedule his colonoscopy to November on a Friday.

## 2023-06-03 ENCOUNTER — Encounter: Payer: Self-pay | Admitting: Gastroenterology

## 2023-06-04 ENCOUNTER — Ambulatory Visit
Admission: RE | Admit: 2023-06-04 | Discharge: 2023-06-04 | Disposition: A | Discharge status: Home / Self Care | Payer: Managed Care, Other (non HMO) | Attending: Gastroenterology | Admitting: Gastroenterology

## 2023-06-04 ENCOUNTER — Encounter
Admission: RE | Disposition: A | Discharge status: Home / Self Care | Payer: Self-pay | Source: Home / Self Care | Attending: Gastroenterology

## 2023-06-04 ENCOUNTER — Ambulatory Visit: Payer: Managed Care, Other (non HMO) | Admitting: Anesthesiology

## 2023-06-04 DIAGNOSIS — K635 Polyp of colon: Secondary | ICD-10-CM

## 2023-06-04 DIAGNOSIS — D122 Benign neoplasm of ascending colon: Secondary | ICD-10-CM | POA: Insufficient documentation

## 2023-06-04 DIAGNOSIS — Z1211 Encounter for screening for malignant neoplasm of colon: Secondary | ICD-10-CM | POA: Diagnosis present

## 2023-06-04 DIAGNOSIS — D126 Benign neoplasm of colon, unspecified: Secondary | ICD-10-CM

## 2023-06-04 DIAGNOSIS — D120 Benign neoplasm of cecum: Secondary | ICD-10-CM | POA: Insufficient documentation

## 2023-06-04 DIAGNOSIS — D125 Benign neoplasm of sigmoid colon: Secondary | ICD-10-CM | POA: Insufficient documentation

## 2023-06-04 HISTORY — PX: HEMOSTASIS CLIP PLACEMENT: SHX6857

## 2023-06-04 HISTORY — PX: POLYPECTOMY: SHX5525

## 2023-06-04 HISTORY — PX: COLONOSCOPY WITH PROPOFOL: SHX5780

## 2023-06-04 SURGERY — COLONOSCOPY WITH PROPOFOL
Anesthesia: General

## 2023-06-04 MED ORDER — SODIUM CHLORIDE 0.9 % IV SOLN
INTRAVENOUS | Status: DC
  Administered 2023-06-04: 20 mL/h via INTRAVENOUS

## 2023-06-04 MED ORDER — PROPOFOL 10 MG/ML IV BOLUS
INTRAVENOUS | Status: DC | PRN
  Administered 2023-06-04: 100 mg via INTRAVENOUS
  Administered 2023-06-04: 30 mg via INTRAVENOUS

## 2023-06-04 MED ORDER — LIDOCAINE HCL (CARDIAC) PF 100 MG/5ML IV SOSY
PREFILLED_SYRINGE | INTRAVENOUS | Status: DC | PRN
  Administered 2023-06-04: 60 mg via INTRAVENOUS

## 2023-06-04 MED ORDER — PHENYLEPHRINE 80 MCG/ML (10ML) SYRINGE FOR IV PUSH (FOR BLOOD PRESSURE SUPPORT)
PREFILLED_SYRINGE | INTRAVENOUS | Status: DC | PRN
  Administered 2023-06-04 (×5): 80 ug via INTRAVENOUS

## 2023-06-04 MED ORDER — PROPOFOL 500 MG/50ML IV EMUL
INTRAVENOUS | Status: DC | PRN
  Administered 2023-06-04: 180 ug/kg/min via INTRAVENOUS

## 2023-06-04 NOTE — Anesthesia Postprocedure Evaluation (Signed)
Anesthesia Post Note  Patient: Alan Barrera  Procedure(s) Performed: COLONOSCOPY WITH PROPOFOL HEMOSTASIS CLIP PLACEMENT POLYPECTOMY  Patient location during evaluation: Endoscopy Anesthesia Type: General Level of consciousness: awake and alert Pain management: pain level controlled Vital Signs Assessment: post-procedure vital signs reviewed and stable Respiratory status: spontaneous breathing, nonlabored ventilation, respiratory function stable and patient connected to nasal cannula oxygen Cardiovascular status: blood pressure returned to baseline and stable Postop Assessment: no apparent nausea or vomiting Anesthetic complications: no   No notable events documented.   Last Vitals:  Vitals:   06/04/23 0807 06/04/23 0932  BP: (!) 136/97 (!) 88/54  Pulse: 66   Resp: 20   Temp: (!) 35.8 C (!) 35.7 C  SpO2: 100%     Last Pain:  Vitals:   06/04/23 0942  TempSrc:   PainSc: 0-No pain                 Louie Boston

## 2023-06-04 NOTE — Transfer of Care (Signed)
Immediate Anesthesia Transfer of Care Note  Patient: Alan Barrera Near  Procedure(s) Performed: COLONOSCOPY WITH PROPOFOL BIOPSY HEMOSTASIS CLIP PLACEMENT  Patient Location: Endoscopy Unit  Anesthesia Type:General  Level of Consciousness: sedated  Airway & Oxygen Therapy: Patient Spontanous Breathing  Post-op Assessment: Report given to RN and Post -op Vital signs reviewed and stable  Post vital signs: Reviewed and stable  Last Vitals:  Vitals Value Taken Time  BP 88/54 06/04/23 0932  Temp 35.7 C 06/04/23 0932  Pulse 70 06/04/23 0933  Resp 18 06/04/23 0933  SpO2 95 % 06/04/23 0933  Vitals shown include unfiled device data.  Last Pain:  Vitals:   06/04/23 0932  TempSrc: Temporal  PainSc: Asleep         Complications: No notable events documented.

## 2023-06-04 NOTE — H&P (Signed)
Wyline Mood, MD [address], [place], [place], [address] [address], [address], [place], [place], [address] Phone: [phone]  Fax: [phone]  Primary Care Physician:  Allegra Grana, FNP   Pre-Procedure History & Physical: HPI:  Alan Barrera is a 47 y.o. male is here for an colonoscopy.   Past Medical History:  Diagnosis Date   Anxiety    Arthritis     Past Surgical History:  Procedure Laterality Date   HERNIA REPAIR     INSERTION OF MESH N/A 06/02/2013   Procedure: INSERTION OF MESH;  Surgeon: Dalia Heading, MD;  Location: [facility];  Service: General;  Laterality: N/A;   TONSILLECTOMY Bilateral    age 65   UMBILICAL HERNIA REPAIR N/A 06/02/2013   Procedure: HERNIA REPAIR UMBILICAL ADULT;  Surgeon: Dalia Heading, MD;  Location: [facility];  Service: General;  Laterality: N/A;   VENTRAL HERNIA REPAIR N/A 09/25/2019   Procedure: HERNIA REPAIR VENTRAL ADULT WITH MESH;  Surgeon: Franky Macho, MD;  Location: [facility];  Service: General;  Laterality: N/A;    Prior to Admission medications   Medication Sig Start Date End Date Taking? Authorizing Provider  calcipotriene (DOVONOX) 0.005 % cream Apply topically 2 (two) times daily. 2 times daily   Yes [provider]  citalopram (CELEXA) 10 MG tablet Take 1 tablet (10 mg total) by mouth daily. 01/26/23  Yes Allegra Grana, FNP  clobetasol ointment (TEMOVATE) 0.05 % APPLY  OINTMENT TOPICALLY TWICE DAILY 01/26/23  Yes Cook, Jayce G, DO  clobetasol ointment (TEMOVATE) 0.05 % Apply 1 Application topically 2 (two) times daily.   Yes [provider]  sildenafil (REVATIO) 20 MG tablet Take 3 tablets po 1 -2 hours prior to sex 11/16/22  Yes Cook, Jayce G, DO  tacrolimus (PROTOPIC) 0.1 % ointment Apply topically 2 (two) times daily.   Yes [provider]  valsartan (DIOVAN) 80 MG tablet Take 1 tablet (80 mg total) by mouth daily. 11/16/22  Yes Everlene Other G, DO    Allergies as of 01/05/2023  - Review Complete 01/05/2023  Allergen Reaction Noted   Hydrocodone  07/17/2016    Family History  Problem Relation Age of Onset   Hypertension Mother    Depression Mother    COPD Mother    Healthy Mother    Hypertension Father    Alcohol abuse Father    AAA (abdominal aortic aneurysm) Father 49       cause of death, smoker   Diabetes Brother    Depression Brother    Hernia Brother    Bone cancer Maternal Grandmother        unsure if primary   Heart disease Maternal Grandfather    Hearing loss Maternal Grandfather    Heart attack Maternal Grandfather    Heart disease Paternal Grandfather     Social History   Socioeconomic History   Marital status: Single    Spouse name: Not on file   Number of children: Not on file   Years of education: Not on file   Highest education level: Not on file  Occupational History   Not on file  Tobacco Use   Smoking status: Never   Smokeless tobacco: Former    Types: Chew    Quit date: 05/22/2019   Tobacco comments:    Chewed tobacco 47 years old for a year or two  Vaping Use   Vaping status: Never Used  Substance and Sexual  Activity   Alcohol use: No   Drug use: Never   Sexual activity: Yes  Other Topics Concern   Not on file  Social History Narrative   Single      Travels for work- [place], [place], [place]   [employer]      2 sons    1 daughter      2 grandchildren      Social Determinants of Health   Financial Resource Strain: Not on file  Food Insecurity: Not on file  Transportation Needs: Not on file  Physical Activity: Not on file  Stress: Not on file  Social Connections: Not on file  Intimate Partner Violence: Not on file    Review of Systems: See HPI, otherwise negative ROS  Physical Exam: BP (!) 136/97   Pulse 66   Temp (!) 96.4 F (35.8 C) (Temporal)   Resp 20   Ht 5\' 10"  (1.778 m)   Wt 82.5 kg   SpO2 100%   BMI 26.09 kg/m  General:   Alert,  pleasant and cooperative in NAD Head:  Normocephalic and  atraumatic. Neck:  Supple; no masses or thyromegaly. Lungs:  Clear throughout to auscultation, normal respiratory effort.    Heart:  +S1, +S2, Regular rate and rhythm, No edema. Abdomen:  Soft, nontender and nondistended. Normal bowel sounds, without guarding, and without rebound.   Neurologic:  Alert and  oriented x4;  grossly normal neurologically.  Impression/Plan: Alan Barrera is here for an colonoscopy to be performed for Screening colonoscopy average risk   Risks, benefits, limitations, and alternatives regarding  colonoscopy have been reviewed with the patient.  Questions have been answered.  All parties agreeable.   Wyline Mood, MD  06/04/2023, 8:21 AM

## 2023-06-04 NOTE — Anesthesia Preprocedure Evaluation (Signed)
Anesthesia Evaluation  Patient identified by MRN, date of birth, ID band Patient awake    Reviewed: Allergy & Precautions, NPO status , Patient's Chart, lab work & pertinent test results  History of Anesthesia Complications Negative for: history of anesthetic complications  Airway Mallampati: III  TM Distance: >3 FB Neck ROM: full    Dental no notable dental hx.    Pulmonary neg pulmonary ROS   Pulmonary exam normal        Cardiovascular hypertension, On Medications Normal cardiovascular exam     Neuro/Psych  PSYCHIATRIC DISORDERS Anxiety     negative neurological ROS     GI/Hepatic negative GI ROS, Neg liver ROS,,,  Endo/Other  negative endocrine ROS    Renal/GU negative Renal ROS  negative genitourinary   Musculoskeletal  (+) Arthritis ,    Abdominal   Peds  Hematology negative hematology ROS (+)   Anesthesia Other Findings Past Medical History: No date: Anxiety No date: Arthritis  Past Surgical History: No date: HERNIA REPAIR 06/02/2013: INSERTION OF MESH; N/A     Comment:  Procedure: INSERTION OF MESH;  Surgeon: Dalia Heading,               MD;  Location: [facility];  Service: General;  Laterality:               N/A; No date: TONSILLECTOMY; Bilateral     Comment:  age 47 06/02/2013: UMBILICAL HERNIA REPAIR; N/A     Comment:  Procedure: HERNIA REPAIR UMBILICAL ADULT;  Surgeon: Dalia Heading, MD;  Location: [facility];  Service: General;                Laterality: N/A; 09/25/2019: VENTRAL HERNIA REPAIR; N/A     Comment:  Procedure: HERNIA REPAIR VENTRAL ADULT WITH MESH;                Surgeon: Franky Macho, MD;  Location: [facility];  Service:               General;  Laterality: N/A;     Reproductive/Obstetrics negative OB ROS                              Anesthesia Physical Anesthesia Plan  ASA: 2  Anesthesia Plan: General   Post-op Pain Management: Minimal or  no pain anticipated   Induction: Intravenous  PONV Risk Score and Plan: 1 and Propofol infusion and TIVA  Airway Management Planned: Natural Airway and Nasal Cannula  Additional Equipment:   Intra-op Plan:   Post-operative Plan:   Informed Consent: I have reviewed the patients History and Physical, chart, labs and discussed the procedure including the risks, benefits and alternatives for the proposed anesthesia with the patient or authorized representative who has indicated his/her understanding and acceptance.     Dental Advisory Given  Plan Discussed with: Anesthesiologist, CRNA and Surgeon  Anesthesia Plan Comments: (Patient consented for risks of anesthesia including but not limited to:  - adverse reactions to medications - risk of airway placement if required - damage to eyes, teeth, lips or other oral mucosa - nerve damage due to positioning  - sore throat or hoarseness - Damage to heart, brain, nerves, lungs, other parts of body or loss of life  Patient voiced understanding and assent.)         Anesthesia Quick Evaluation

## 2023-06-04 NOTE — Op Note (Signed)
[facility]enterology Patient Name: Alan Barrera Procedure Date: 06/04/2023 8:57 AM MRN: [id number] Account #: [id number] Date of Birth: 1975-11-17 Admit Type: Outpatient Age: 47 Room: [facility] ENDO ROOM 4 Gender: Male Note Status: Finalized Instrument Name: Prentice Docker [id number] Procedure:             Colonoscopy Indications:           Screening for colorectal malignant neoplasm Providers:             Wyline Mood MD, MD Medicines:             Monitored Anesthesia Care Complications:         No immediate complications. Procedure:             Pre-Anesthesia Assessment:                        - Prior to the procedure, a History and Physical was                         performed, and patient medications, allergies and                         sensitivities were reviewed. The patient's tolerance                         of previous anesthesia was reviewed.                        - The risks and benefits of the procedure and the                         sedation options and risks were discussed with the                         patient. All questions were answered and informed                         consent was obtained.                        - ASA Grade Assessment: II - A patient with mild                         systemic disease.                        After obtaining informed consent, the colonoscope was                         passed under direct vision. Throughout the procedure,                         the patient's blood pressure, pulse, and oxygen                         saturations were monitored continuously. The                         Colonoscope was introduced through the anus and  advanced to the the cecum, identified by the                         appendiceal orifice. The colonoscopy was performed                         with ease. The patient tolerated the procedure well.                         The quality of the bowel  preparation was excellent.                         The ileocecal valve, appendiceal orifice, and rectum                         were photographed. Findings:      The perianal and digital rectal examinations were normal.      A 15 mm polyp was found in the sigmoid colon. The polyp was       pedunculated. The polyp was removed with a hot snare. Resection and       retrieval were complete. To prevent bleeding post-intervention, one       hemostatic clip was successfully placed. Clip manufacturer: [employer]. There was no bleeding at the end of the procedure.      Three sessile polyps were found in the ascending colon. The polyps were       4 to 5 mm in size. These polyps were removed with a cold snare.       Resection and retrieval were complete.      A 3 mm polyp was found in the cecum. The polyp was sessile. The polyp       was removed with a jumbo cold forceps. Resection and retrieval were       complete.      The exam was otherwise without abnormality on direct and retroflexion       views. Impression:            - One 15 mm polyp in the sigmoid colon, removed with a                         hot snare. Resected and retrieved. Clip was placed.                         Clip manufacturer: [employer].                        - Three 4 to 5 mm polyps in the ascending colon,                         removed with a cold snare. Resected and retrieved.                        - One 3 mm polyp in the cecum, removed with a jumbo                         cold forceps. Resected and retrieved.                        -  The examination was otherwise normal on direct and                         retroflexion views. Recommendation:        - Discharge patient to home (with escort).                        - Resume previous diet.                        - Continue present medications.                        - Await pathology results.                        - Repeat colonoscopy in 3 years for  surveillance. Procedure Code(s):     --- Professional ---                        [phone], Colonoscopy, flexible; with removal of                         tumor(s), polyp(s), or other lesion(s) by snare                         technique                        45380, 59, Colonoscopy, flexible; with biopsy, single                         or multiple Diagnosis Code(s):     --- Professional ---                        Z12.11, Encounter for screening for malignant neoplasm                         of colon                        D12.5, Benign neoplasm of sigmoid colon                        D12.0, Benign neoplasm of cecum                        D12.2, Benign neoplasm of ascending colon CPT copyright 2022 American Medical Association. All rights reserved. The codes documented in this report are preliminary and upon coder review may  be revised to meet current compliance requirements. Wyline Mood, MD Wyline Mood MD, MD 06/04/2023 9:30:25 AM This report has been signed electronically. Number of Addenda: 0 Note Initiated On: 06/04/2023 8:57 AM Scope Withdrawal Time: 0 hours 17 minutes 25 seconds  Total Procedure Duration: 0 hours 19 minutes 23 seconds  Estimated Blood Loss:  Estimated blood loss: none.      [facility]

## 2023-06-07 ENCOUNTER — Encounter: Payer: Self-pay | Admitting: Gastroenterology

## 2023-06-07 LAB — SURGICAL PATHOLOGY

## 2023-06-08 ENCOUNTER — Encounter: Payer: Self-pay | Admitting: Gastroenterology

## 2023-07-30 ENCOUNTER — Ambulatory Visit: Payer: Managed Care, Other (non HMO) | Admitting: Family

## 2024-01-14 ENCOUNTER — Other Ambulatory Visit: Payer: Self-pay | Admitting: Family Medicine

## 2024-01-20 ENCOUNTER — Ambulatory Visit (INDEPENDENT_AMBULATORY_CARE_PROVIDER_SITE_OTHER): Admitting: Family Medicine

## 2024-01-20 VITALS — BP 117/83 | HR 87 | Temp 98.8°F | Ht 70.0 in | Wt 189.4 lb

## 2024-01-20 DIAGNOSIS — Z130 Encounter for screening for diseases of the blood and blood-forming organs and certain disorders involving the immune mechanism: Secondary | ICD-10-CM

## 2024-01-20 DIAGNOSIS — Z872 Personal history of diseases of the skin and subcutaneous tissue: Secondary | ICD-10-CM | POA: Insufficient documentation

## 2024-01-20 DIAGNOSIS — L404 Guttate psoriasis: Secondary | ICD-10-CM

## 2024-01-20 DIAGNOSIS — E782 Mixed hyperlipidemia: Secondary | ICD-10-CM | POA: Diagnosis not present

## 2024-01-20 DIAGNOSIS — N5201 Erectile dysfunction due to arterial insufficiency: Secondary | ICD-10-CM

## 2024-01-20 DIAGNOSIS — I10 Essential (primary) hypertension: Secondary | ICD-10-CM

## 2024-01-20 DIAGNOSIS — F411 Generalized anxiety disorder: Secondary | ICD-10-CM

## 2024-01-20 MED ORDER — SILDENAFIL CITRATE 20 MG PO TABS
ORAL_TABLET | ORAL | 3 refills | Status: AC
Start: 2024-01-20 — End: ?

## 2024-01-20 MED ORDER — VALSARTAN 80 MG PO TABS: 80.0000 mg | ORAL_TABLET | Freq: Every day | ORAL | 3 refills | Status: AC

## 2024-01-20 NOTE — Patient Instructions (Signed)
 Medications refilled.  Follow up annually.

## 2024-01-23 NOTE — Assessment & Plan Note (Signed)
Stable.  Sildenafil refilled. °

## 2024-01-23 NOTE — Progress Notes (Signed)
 Subjective:  Patient ID: Alan Barrera, male    DOB: 1976/06/08  Age: 48 y.o. MRN: [id number]  CC:  Follow up   HPI:  48 year old male presents for follow up.  Overall he is doing well. Psoriasis well controlled on Taltz. Follows with [facility].  HTN stable on Valsartan . Needs refill.   Anxiety stable on Celexa .   Needs refill of Sildenafil .   Patient requesting labs.   Patient Active Problem List   Diagnosis Date Noted   Adenomatous polyp of colon 06/04/2023   Guttate psoriasis 11/16/2022   Essential hypertension 11/16/2022   Erectile dysfunction 09/05/2018   Generalized anxiety disorder 03/30/2017    Social Hx   Social History   Socioeconomic History   Marital status: Single    Spouse name: Not on file   Number of children: Not on file   Years of education: Not on file   Highest education level: Not on file  Occupational History   Not on file  Tobacco Use   Smoking status: Never   Smokeless tobacco: Former    Types: Chew    Quit date: 05/22/2019   Tobacco comments:    Chewed tobacco 48 years old for a year or two  Vaping Use   Vaping status: Never Used  Substance and Sexual Activity   Alcohol use: No   Drug use: Never   Sexual activity: Yes  Other Topics Concern   Not on file  Social History Narrative   Single      Travels for work- [place], [place], [place]   [employer]      2 sons    1 daughter      2 grandchildren      Social Drivers of Corporate investment banker Strain: Not on file  Food Insecurity: Not on file  Transportation Needs: Not on file  Physical Activity: Not on file  Stress: Not on file  Social Connections: Not on file    Review of Systems Per HPI  Objective:  BP 117/83   Pulse 87   Temp 98.8 F (37.1 C)   Ht 5' 10 (1.778 m)   Wt 189 lb 6.4 oz (85.9 kg)   SpO2 97%   BMI 27.18 kg/m      01/20/2024    2:44 PM 06/04/2023    9:32 AM 06/04/2023    8:07 AM  BP/Weight  Systolic BP 117 88 136  Diastolic BP 83  54 97  Wt. (Lbs) 189.4  181.8  BMI 27.18 kg/m2  26.09 kg/m2    Physical Exam Vitals and nursing note reviewed.  Constitutional:      General: He is not in acute distress.    Appearance: Normal appearance.  HENT:     Head: Normocephalic and atraumatic.   Eyes:     General:        Right eye: No discharge.        Left eye: No discharge.     Conjunctiva/sclera: Conjunctivae normal.    Cardiovascular:     Rate and Rhythm: Normal rate and regular rhythm.  Pulmonary:     Effort: Pulmonary effort is normal.     Breath sounds: Normal breath sounds. No wheezing, rhonchi or rales.   Neurological:     Mental Status: He is alert.   Psychiatric:        Mood and Affect: Mood normal.        Behavior: Behavior normal.     Lab Results  Component Value Date   WBC 4.7 02/26/2022   HGB 16.0 02/26/2022   HCT 45.8 02/26/2022   PLT 177 02/26/2022   GLUCOSE 131 (H) 11/17/2022   CHOL 214 (H) 02/26/2022   TRIG 294 (H) 02/26/2022   HDL 44 02/26/2022   LDLCALC 119 (H) 02/26/2022   ALT 29 02/26/2022   AST 28 02/26/2022   NA 139 11/17/2022   K 4.4 11/17/2022   CL 102 11/17/2022   CREATININE 0.96 11/17/2022   BUN 11 11/17/2022   CO2 22 11/17/2022   HGBA1C 5.5 02/26/2022     Assessment & Plan:  Essential hypertension Assessment & Plan: Stable. Labs ordered. Continue Valsartan . Refilled today.  Orders: -     Valsartan ; Take 1 tablet (80 mg total) by mouth daily.  Dispense: 90 tablet; Refill: 3 -     CMP14+EGFR  Erectile dysfunction due to arterial insufficiency Assessment & Plan: Stable. Sildenafil  refilled.  Orders: -     Sildenafil  Citrate; Take 3 tablets po 1 -2 hours prior to sex  Dispense: 90 tablet; Refill: 3  Mixed hyperlipidemia -     Lipid panel  Screening for deficiency anemia -     CBC  Guttate psoriasis Assessment & Plan: Well controlled.   Generalized anxiety disorder Assessment & Plan: Stable on Celexa .     Follow-up:  Annually  Micha Dosanjh  DO [facility]

## 2024-01-23 NOTE — Assessment & Plan Note (Signed)
 Stable. Labs ordered. Continue Valsartan . Refilled today.

## 2024-01-23 NOTE — Assessment & Plan Note (Signed)
 Stable on Celexa.

## 2024-01-23 NOTE — Assessment & Plan Note (Signed)
 Well controlled

## 2024-08-03 ENCOUNTER — Other Ambulatory Visit: Payer: Self-pay | Admitting: Family Medicine

## 2024-08-03 DIAGNOSIS — F411 Generalized anxiety disorder: Secondary | ICD-10-CM

## 2024-08-03 NOTE — Telephone Encounter (Signed)
 Copied from CRM #[id number]. Topic: Clinical - Medication Refill >> Aug 03, 2024  9:37 AM Carlyon D wrote: Medication: citalopram  (CELEXA ) 10 MG tablet  Has the patient contacted their pharmacy? No (Agent: If no, request that the patient contact the pharmacy for the refill. If patient does not wish to contact the pharmacy document the reason why and proceed with request.) (Agent: If yes, when and what did the pharmacy advise?)  This is the patient's preferred pharmacy:  [facility] [address] [place] - [address] [place] [address] Phone: [phone] Fax: [phone]   Is this the correct pharmacy for this prescription? Yes If no, delete pharmacy and type the correct one.   Has the prescription been filled recently? No  Is the patient out of the medication? Yes  Has the patient been seen for an appointment in the last year OR does the patient have an upcoming appointment? Yes  Can we respond through MyChart? Yes  Agent: Please be advised that Rx refills may take up to 3 business days. We ask that you follow-up with your pharmacy.
